# Patient Record
Sex: Female | Born: 1950 | Race: Black or African American | Hispanic: No | Marital: Single | Smoking: Current every day smoker
Health system: Southern US, Community
[De-identification: ages and names within clinical notes are randomized; demographics above are authoritative.]

## PROBLEM LIST (undated history)

## (undated) DIAGNOSIS — E119 Type 2 diabetes mellitus without complications: Secondary | ICD-10-CM

## (undated) DIAGNOSIS — I10 Essential (primary) hypertension: Secondary | ICD-10-CM

## (undated) DIAGNOSIS — I509 Heart failure, unspecified: Secondary | ICD-10-CM

## (undated) HISTORY — PX: CAROTID ENDARTERECTOMY: SUR193

---

## 2011-05-07 DIAGNOSIS — E119 Type 2 diabetes mellitus without complications: Secondary | ICD-10-CM

## 2011-05-07 DIAGNOSIS — I10 Essential (primary) hypertension: Secondary | ICD-10-CM | POA: Insufficient documentation

## 2016-07-11 DIAGNOSIS — I639 Cerebral infarction, unspecified: Secondary | ICD-10-CM

## 2016-07-11 HISTORY — DX: Cerebral infarction, unspecified: I63.9

## 2017-06-09 DIAGNOSIS — I10 Essential (primary) hypertension: Secondary | ICD-10-CM | POA: Diagnosis present

## 2017-06-09 DIAGNOSIS — E785 Hyperlipidemia, unspecified: Secondary | ICD-10-CM | POA: Diagnosis present

## 2018-01-16 DIAGNOSIS — E113519 Type 2 diabetes mellitus with proliferative diabetic retinopathy with macular edema, unspecified eye: Secondary | ICD-10-CM | POA: Insufficient documentation

## 2018-01-25 DIAGNOSIS — R011 Cardiac murmur, unspecified: Secondary | ICD-10-CM | POA: Insufficient documentation

## 2018-05-24 DIAGNOSIS — I779 Disorder of arteries and arterioles, unspecified: Secondary | ICD-10-CM | POA: Insufficient documentation

## 2019-02-24 DIAGNOSIS — E1121 Type 2 diabetes mellitus with diabetic nephropathy: Secondary | ICD-10-CM | POA: Diagnosis present

## 2020-01-29 DIAGNOSIS — D509 Iron deficiency anemia, unspecified: Secondary | ICD-10-CM | POA: Insufficient documentation

## 2020-03-02 DIAGNOSIS — D472 Monoclonal gammopathy: Secondary | ICD-10-CM | POA: Insufficient documentation

## 2020-09-20 ENCOUNTER — Encounter (HOSPITAL_COMMUNITY): Payer: Self-pay

## 2020-09-20 ENCOUNTER — Other Ambulatory Visit: Payer: Self-pay

## 2020-09-20 ENCOUNTER — Other Ambulatory Visit (HOSPITAL_COMMUNITY): Payer: Self-pay

## 2020-09-20 ENCOUNTER — Emergency Department (HOSPITAL_COMMUNITY): Payer: Medicare Other

## 2020-09-20 DIAGNOSIS — R0902 Hypoxemia: Secondary | ICD-10-CM

## 2020-09-20 DIAGNOSIS — K0530 Chronic periodontitis, unspecified: Secondary | ICD-10-CM | POA: Diagnosis present

## 2020-09-20 DIAGNOSIS — E119 Type 2 diabetes mellitus without complications: Secondary | ICD-10-CM | POA: Diagnosis not present

## 2020-09-20 DIAGNOSIS — K0889 Other specified disorders of teeth and supporting structures: Secondary | ICD-10-CM

## 2020-09-20 DIAGNOSIS — K036 Deposits [accretions] on teeth: Secondary | ICD-10-CM

## 2020-09-20 DIAGNOSIS — J9621 Acute and chronic respiratory failure with hypoxia: Secondary | ICD-10-CM | POA: Diagnosis present

## 2020-09-20 DIAGNOSIS — Z803 Family history of malignant neoplasm of breast: Secondary | ICD-10-CM

## 2020-09-20 DIAGNOSIS — I21A1 Myocardial infarction type 2: Secondary | ICD-10-CM | POA: Diagnosis present

## 2020-09-20 DIAGNOSIS — Z20822 Contact with and (suspected) exposure to covid-19: Secondary | ICD-10-CM | POA: Diagnosis present

## 2020-09-20 DIAGNOSIS — I352 Nonrheumatic aortic (valve) stenosis with insufficiency: Secondary | ICD-10-CM | POA: Diagnosis present

## 2020-09-20 DIAGNOSIS — Z801 Family history of malignant neoplasm of trachea, bronchus and lung: Secondary | ICD-10-CM

## 2020-09-20 DIAGNOSIS — I5043 Acute on chronic combined systolic (congestive) and diastolic (congestive) heart failure: Secondary | ICD-10-CM | POA: Diagnosis present

## 2020-09-20 DIAGNOSIS — K5900 Constipation, unspecified: Secondary | ICD-10-CM | POA: Diagnosis not present

## 2020-09-20 DIAGNOSIS — I214 Non-ST elevation (NSTEMI) myocardial infarction: Secondary | ICD-10-CM | POA: Diagnosis present

## 2020-09-20 DIAGNOSIS — E1165 Type 2 diabetes mellitus with hyperglycemia: Secondary | ICD-10-CM | POA: Diagnosis present

## 2020-09-20 DIAGNOSIS — Z91041 Radiographic dye allergy status: Secondary | ICD-10-CM

## 2020-09-20 DIAGNOSIS — R0603 Acute respiratory distress: Secondary | ICD-10-CM | POA: Diagnosis present

## 2020-09-20 DIAGNOSIS — R9431 Abnormal electrocardiogram [ECG] [EKG]: Secondary | ICD-10-CM | POA: Diagnosis not present

## 2020-09-20 DIAGNOSIS — Z7984 Long term (current) use of oral hypoglycemic drugs: Secondary | ICD-10-CM | POA: Diagnosis not present

## 2020-09-20 DIAGNOSIS — I2510 Atherosclerotic heart disease of native coronary artery without angina pectoris: Secondary | ICD-10-CM | POA: Diagnosis present

## 2020-09-20 DIAGNOSIS — N1832 Chronic kidney disease, stage 3b: Secondary | ICD-10-CM | POA: Diagnosis present

## 2020-09-20 DIAGNOSIS — E43 Unspecified severe protein-calorie malnutrition: Secondary | ICD-10-CM | POA: Diagnosis present

## 2020-09-20 DIAGNOSIS — N179 Acute kidney failure, unspecified: Secondary | ICD-10-CM | POA: Diagnosis present

## 2020-09-20 DIAGNOSIS — D631 Anemia in chronic kidney disease: Secondary | ICD-10-CM | POA: Diagnosis present

## 2020-09-20 DIAGNOSIS — F17210 Nicotine dependence, cigarettes, uncomplicated: Secondary | ICD-10-CM | POA: Diagnosis present

## 2020-09-20 DIAGNOSIS — K029 Dental caries, unspecified: Secondary | ICD-10-CM | POA: Diagnosis present

## 2020-09-20 DIAGNOSIS — G9341 Metabolic encephalopathy: Secondary | ICD-10-CM | POA: Diagnosis not present

## 2020-09-20 DIAGNOSIS — R0602 Shortness of breath: Secondary | ICD-10-CM

## 2020-09-20 DIAGNOSIS — Z66 Do not resuscitate: Secondary | ICD-10-CM | POA: Diagnosis not present

## 2020-09-20 DIAGNOSIS — N183 Chronic kidney disease, stage 3 unspecified: Secondary | ICD-10-CM | POA: Diagnosis present

## 2020-09-20 DIAGNOSIS — I350 Nonrheumatic aortic (valve) stenosis: Secondary | ICD-10-CM | POA: Diagnosis not present

## 2020-09-20 DIAGNOSIS — R570 Cardiogenic shock: Secondary | ICD-10-CM | POA: Diagnosis not present

## 2020-09-20 DIAGNOSIS — I70221 Atherosclerosis of native arteries of extremities with rest pain, right leg: Secondary | ICD-10-CM | POA: Diagnosis not present

## 2020-09-20 DIAGNOSIS — M79606 Pain in leg, unspecified: Secondary | ICD-10-CM | POA: Diagnosis not present

## 2020-09-20 DIAGNOSIS — E872 Acidosis, unspecified: Secondary | ICD-10-CM

## 2020-09-20 DIAGNOSIS — E1122 Type 2 diabetes mellitus with diabetic chronic kidney disease: Secondary | ICD-10-CM | POA: Diagnosis present

## 2020-09-20 DIAGNOSIS — Z972 Presence of dental prosthetic device (complete) (partial): Secondary | ICD-10-CM | POA: Diagnosis not present

## 2020-09-20 DIAGNOSIS — I25118 Atherosclerotic heart disease of native coronary artery with other forms of angina pectoris: Secondary | ICD-10-CM | POA: Diagnosis not present

## 2020-09-20 DIAGNOSIS — E1151 Type 2 diabetes mellitus with diabetic peripheral angiopathy without gangrene: Secondary | ICD-10-CM | POA: Diagnosis present

## 2020-09-20 DIAGNOSIS — Z79899 Other long term (current) drug therapy: Secondary | ICD-10-CM

## 2020-09-20 DIAGNOSIS — R34 Anuria and oliguria: Secondary | ICD-10-CM | POA: Diagnosis not present

## 2020-09-20 DIAGNOSIS — K056 Periodontal disease, unspecified: Secondary | ICD-10-CM

## 2020-09-20 DIAGNOSIS — Z8249 Family history of ischemic heart disease and other diseases of the circulatory system: Secondary | ICD-10-CM | POA: Diagnosis not present

## 2020-09-20 DIAGNOSIS — E785 Hyperlipidemia, unspecified: Secondary | ICD-10-CM | POA: Diagnosis present

## 2020-09-20 DIAGNOSIS — E1159 Type 2 diabetes mellitus with other circulatory complications: Secondary | ICD-10-CM | POA: Diagnosis not present

## 2020-09-20 DIAGNOSIS — I471 Supraventricular tachycardia: Secondary | ICD-10-CM | POA: Diagnosis not present

## 2020-09-20 DIAGNOSIS — I69328 Other speech and language deficits following cerebral infarction: Secondary | ICD-10-CM

## 2020-09-20 DIAGNOSIS — N184 Chronic kidney disease, stage 4 (severe): Secondary | ICD-10-CM | POA: Diagnosis not present

## 2020-09-20 DIAGNOSIS — Z7189 Other specified counseling: Secondary | ICD-10-CM | POA: Diagnosis not present

## 2020-09-20 DIAGNOSIS — Z6824 Body mass index (BMI) 24.0-24.9, adult: Secondary | ICD-10-CM

## 2020-09-20 DIAGNOSIS — I5023 Acute on chronic systolic (congestive) heart failure: Secondary | ICD-10-CM | POA: Diagnosis present

## 2020-09-20 DIAGNOSIS — Z7982 Long term (current) use of aspirin: Secondary | ICD-10-CM

## 2020-09-20 DIAGNOSIS — Z886 Allergy status to analgesic agent status: Secondary | ICD-10-CM

## 2020-09-20 DIAGNOSIS — I739 Peripheral vascular disease, unspecified: Secondary | ICD-10-CM | POA: Diagnosis not present

## 2020-09-20 DIAGNOSIS — K011 Impacted teeth: Secondary | ICD-10-CM

## 2020-09-20 DIAGNOSIS — I130 Hypertensive heart and chronic kidney disease with heart failure and stage 1 through stage 4 chronic kidney disease, or unspecified chronic kidney disease: Principal | ICD-10-CM | POA: Diagnosis present

## 2020-09-20 DIAGNOSIS — I25110 Atherosclerotic heart disease of native coronary artery with unstable angina pectoris: Secondary | ICD-10-CM | POA: Diagnosis not present

## 2020-09-20 DIAGNOSIS — N141 Nephropathy induced by other drugs, medicaments and biological substances: Secondary | ICD-10-CM | POA: Diagnosis not present

## 2020-09-20 DIAGNOSIS — I10 Essential (primary) hypertension: Secondary | ICD-10-CM | POA: Diagnosis present

## 2020-09-20 DIAGNOSIS — I493 Ventricular premature depolarization: Secondary | ICD-10-CM | POA: Diagnosis not present

## 2020-09-20 DIAGNOSIS — E782 Mixed hyperlipidemia: Secondary | ICD-10-CM | POA: Diagnosis not present

## 2020-09-20 DIAGNOSIS — I255 Ischemic cardiomyopathy: Secondary | ICD-10-CM | POA: Diagnosis present

## 2020-09-20 DIAGNOSIS — N171 Acute kidney failure with acute cortical necrosis: Secondary | ICD-10-CM | POA: Diagnosis not present

## 2020-09-20 DIAGNOSIS — E11649 Type 2 diabetes mellitus with hypoglycemia without coma: Secondary | ICD-10-CM | POA: Diagnosis not present

## 2020-09-20 DIAGNOSIS — I998 Other disorder of circulatory system: Secondary | ICD-10-CM | POA: Diagnosis not present

## 2020-09-20 DIAGNOSIS — R739 Hyperglycemia, unspecified: Secondary | ICD-10-CM

## 2020-09-20 DIAGNOSIS — M25512 Pain in left shoulder: Secondary | ICD-10-CM | POA: Diagnosis not present

## 2020-09-20 DIAGNOSIS — Z515 Encounter for palliative care: Secondary | ICD-10-CM | POA: Diagnosis not present

## 2020-09-20 DIAGNOSIS — M79604 Pain in right leg: Secondary | ICD-10-CM | POA: Diagnosis not present

## 2020-09-20 DIAGNOSIS — Z01818 Encounter for other preprocedural examination: Secondary | ICD-10-CM

## 2020-09-20 DIAGNOSIS — Z7902 Long term (current) use of antithrombotics/antiplatelets: Secondary | ICD-10-CM

## 2020-09-20 DIAGNOSIS — K08109 Complete loss of teeth, unspecified cause, unspecified class: Secondary | ICD-10-CM

## 2020-09-20 DIAGNOSIS — J449 Chronic obstructive pulmonary disease, unspecified: Secondary | ICD-10-CM | POA: Diagnosis present

## 2020-09-20 DIAGNOSIS — J9601 Acute respiratory failure with hypoxia: Secondary | ICD-10-CM | POA: Diagnosis not present

## 2020-09-20 DIAGNOSIS — I6523 Occlusion and stenosis of bilateral carotid arteries: Secondary | ICD-10-CM | POA: Diagnosis present

## 2020-09-20 DIAGNOSIS — E875 Hyperkalemia: Secondary | ICD-10-CM | POA: Diagnosis not present

## 2020-09-20 DIAGNOSIS — I509 Heart failure, unspecified: Secondary | ICD-10-CM

## 2020-09-20 DIAGNOSIS — E1121 Type 2 diabetes mellitus with diabetic nephropathy: Secondary | ICD-10-CM | POA: Diagnosis present

## 2020-09-20 DIAGNOSIS — T508X5A Adverse effect of diagnostic agents, initial encounter: Secondary | ICD-10-CM | POA: Diagnosis not present

## 2020-09-20 DIAGNOSIS — I252 Old myocardial infarction: Secondary | ICD-10-CM

## 2020-09-20 HISTORY — DX: Heart failure, unspecified: I50.9

## 2020-09-20 HISTORY — DX: Essential (primary) hypertension: I10

## 2020-09-20 HISTORY — DX: Type 2 diabetes mellitus without complications: E11.9

## 2020-09-20 LAB — TROPONIN I (HIGH SENSITIVITY)
Troponin I (High Sensitivity): 68 ng/L — ABNORMAL HIGH (ref <18)
Troponin I (High Sensitivity): 318 ng/L (ref <18)
Troponin I (High Sensitivity): 1109 ng/L (ref <18)
Troponin I (High Sensitivity): 1413 ng/L (ref <18)
Troponin I (High Sensitivity): 2156 ng/L (ref <18)

## 2020-09-20 LAB — HEMOGLOBIN A1C
Hgb A1c MFr Bld: 9.3 % — ABNORMAL HIGH (ref 4.8–5.6)
Mean Plasma Glucose: 220.21 mg/dL

## 2020-09-20 LAB — BASIC METABOLIC PANEL
Anion gap: 11 (ref 5–15)
BUN: 25 mg/dL — ABNORMAL HIGH (ref 8–23)
CO2: 16 mmol/L — ABNORMAL LOW (ref 22–32)
Calcium: 8.1 mg/dL — ABNORMAL LOW (ref 8.9–10.3)
Chloride: 108 mmol/L (ref 98–111)
Creatinine, Ser: 1.9 mg/dL — ABNORMAL HIGH (ref 0.44–1.00)
GFR, Estimated: 28 mL/min — ABNORMAL LOW (ref >60)
Glucose, Bld: 537 mg/dL (ref 70–99)
Potassium: 3.7 mmol/L (ref 3.5–5.1)
Sodium: 135 mmol/L (ref 135–145)

## 2020-09-20 LAB — CBC
HCT: 31.5 % — ABNORMAL LOW (ref 36.0–46.0)
HCT: 30 % — ABNORMAL LOW (ref 36.0–46.0)
Hemoglobin: 9.6 g/dL — ABNORMAL LOW (ref 12.0–15.0)
Hemoglobin: 9.8 g/dL — ABNORMAL LOW (ref 12.0–15.0)
MCH: 30.5 pg (ref 26.0–34.0)
MCH: 30.5 pg (ref 26.0–34.0)
MCHC: 30.5 g/dL (ref 30.0–36.0)
MCHC: 32.7 g/dL (ref 30.0–36.0)
MCV: 100 fL (ref 80.0–100.0)
MCV: 93.5 fL (ref 80.0–100.0)
Platelets: 463 10*3/uL — ABNORMAL HIGH (ref 150–400)
Platelets: 409 10*3/uL — ABNORMAL HIGH (ref 150–400)
RBC: 3.15 MIL/uL — ABNORMAL LOW (ref 3.87–5.11)
RBC: 3.21 MIL/uL — ABNORMAL LOW (ref 3.87–5.11)
RDW: 13 % (ref 11.5–15.5)
RDW: 12.8 % (ref 11.5–15.5)
WBC: 8.7 10*3/uL (ref 4.0–10.5)
WBC: 8 10*3/uL (ref 4.0–10.5)
nRBC: 0 % (ref 0.0–0.2)
nRBC: 0 % (ref 0.0–0.2)

## 2020-09-20 LAB — I-STAT VENOUS BLOOD GAS, ED
Acid-base deficit: 9 mmol/L — ABNORMAL HIGH (ref 0.0–2.0)
Bicarbonate: 18.3 mmol/L — ABNORMAL LOW (ref 20.0–28.0)
Calcium, Ion: 1.08 mmol/L — ABNORMAL LOW (ref 1.15–1.40)
HCT: 30 % — ABNORMAL LOW (ref 36.0–46.0)
Hemoglobin: 10.2 g/dL — ABNORMAL LOW (ref 12.0–15.0)
O2 Saturation: 98 %
Potassium: 3.6 mmol/L (ref 3.5–5.1)
Sodium: 137 mmol/L (ref 135–145)
TCO2: 20 mmol/L — ABNORMAL LOW (ref 22–32)
pCO2, Ven: 44.5 mmHg (ref 44.0–60.0)
pH, Ven: 7.222 — ABNORMAL LOW (ref 7.250–7.430)
pO2, Ven: 132 mmHg — ABNORMAL HIGH (ref 32.0–45.0)

## 2020-09-20 LAB — HIV ANTIBODY (ROUTINE TESTING W REFLEX): HIV Screen 4th Generation wRfx: NONREACTIVE

## 2020-09-20 LAB — GLUCOSE, CAPILLARY
Glucose-Capillary: 267 mg/dL — ABNORMAL HIGH (ref 70–99)
Glucose-Capillary: 73 mg/dL (ref 70–99)

## 2020-09-20 LAB — RESP PANEL BY RT-PCR (FLU A&B, COVID) ARPGX2
Influenza A by PCR: NEGATIVE
Influenza B by PCR: NEGATIVE
SARS Coronavirus 2 by RT PCR: NEGATIVE

## 2020-09-20 LAB — LACTIC ACID, PLASMA
Lactic Acid, Venous: 2.4 mmol/L (ref 0.5–1.9)
Lactic Acid, Venous: 1.2 mmol/L (ref 0.5–1.9)

## 2020-09-20 LAB — BRAIN NATRIURETIC PEPTIDE: B Natriuretic Peptide: 1649.4 pg/mL — ABNORMAL HIGH (ref 0.0–100.0)

## 2020-09-20 LAB — HEPARIN LEVEL (UNFRACTIONATED): Heparin Unfractionated: 0.4 IU/mL (ref 0.30–0.70)

## 2020-09-20 MED ORDER — INSULIN ASPART 100 UNIT/ML ~~LOC~~ SOLN
8.0000 [IU] | Freq: Once | SUBCUTANEOUS | Status: AC
  Administered 2020-09-20: 8 [IU] via SUBCUTANEOUS

## 2020-09-20 MED ORDER — ENOXAPARIN SODIUM 30 MG/0.3ML ~~LOC~~ SOLN
30.0000 mg | SUBCUTANEOUS | Status: DC
  Administered 2020-09-20: 30 mg via SUBCUTANEOUS
  Filled 2020-09-20: qty 0.3

## 2020-09-20 MED ORDER — ASPIRIN EC 81 MG PO TBEC
81.0000 mg | DELAYED_RELEASE_TABLET | Freq: Every day | ORAL | Status: DC
  Administered 2020-09-21 – 2020-09-30 (×9): 81 mg via ORAL
  Filled 2020-09-20 (×11): qty 1

## 2020-09-20 MED ORDER — ISOSORBIDE MONONITRATE ER 30 MG PO TB24
30.0000 mg | ORAL_TABLET | Freq: Every day | ORAL | Status: DC
  Administered 2020-09-20 – 2020-09-30 (×11): 30 mg via ORAL
  Filled 2020-09-20 (×11): qty 1

## 2020-09-20 MED ORDER — FUROSEMIDE 10 MG/ML IJ SOLN
40.0000 mg | Freq: Once | INTRAMUSCULAR | Status: AC
  Administered 2020-09-20: 40 mg via INTRAVENOUS
  Filled 2020-09-20: qty 4

## 2020-09-20 MED ORDER — ASPIRIN 81 MG PO CHEW
324.0000 mg | CHEWABLE_TABLET | Freq: Once | ORAL | Status: AC
  Administered 2020-09-20: 324 mg via ORAL
  Filled 2020-09-20: qty 4

## 2020-09-20 MED ORDER — SODIUM CHLORIDE 0.9% FLUSH
3.0000 mL | INTRAVENOUS | Status: DC | PRN
Start: 2020-09-20 — End: 2020-09-29

## 2020-09-20 MED ORDER — AMLODIPINE BESYLATE 10 MG PO TABS
10.0000 mg | ORAL_TABLET | Freq: Every day | ORAL | Status: DC
  Administered 2020-09-20 – 2020-09-22 (×3): 10 mg via ORAL
  Filled 2020-09-20 (×3): qty 1

## 2020-09-20 MED ORDER — HEPARIN BOLUS VIA INFUSION
2000.0000 [IU] | Freq: Once | INTRAVENOUS | Status: AC
  Administered 2020-09-20: 2000 [IU] via INTRAVENOUS
  Filled 2020-09-20: qty 2000

## 2020-09-20 MED ORDER — INSULIN ASPART 100 UNIT/ML ~~LOC~~ SOLN
3.0000 [IU] | Freq: Three times a day (TID) | SUBCUTANEOUS | Status: DC
  Administered 2020-09-20 – 2020-09-30 (×25): 3 [IU] via SUBCUTANEOUS

## 2020-09-20 MED ORDER — SODIUM CHLORIDE 0.9% FLUSH
3.0000 mL | Freq: Two times a day (BID) | INTRAVENOUS | Status: DC
  Administered 2020-09-20 – 2020-09-28 (×9): 3 mL via INTRAVENOUS

## 2020-09-20 MED ORDER — NITROGLYCERIN 2 % TD OINT: 1.0000 [in_us] | TOPICAL_OINTMENT | Freq: Four times a day (QID) | TRANSDERMAL | Status: DC

## 2020-09-20 MED ORDER — SODIUM CHLORIDE 0.9 % IV SOLN
250.0000 mL | INTRAVENOUS | Status: DC | PRN
  Administered 2020-09-24 – 2020-09-26 (×2): 250 mL via INTRAVENOUS

## 2020-09-20 MED ORDER — EZETIMIBE 10 MG PO TABS
10.0000 mg | ORAL_TABLET | Freq: Every day | ORAL | Status: DC
Start: 2020-09-20 — End: 2020-10-01
  Administered 2020-09-20 – 2020-09-30 (×11): 10 mg via ORAL
  Filled 2020-09-20 (×12): qty 1

## 2020-09-20 MED ORDER — ONDANSETRON HCL 4 MG/2ML IJ SOLN
4.0000 mg | Freq: Four times a day (QID) | INTRAMUSCULAR | Status: DC | PRN
Start: 2020-09-20 — End: 2020-09-24

## 2020-09-20 MED ORDER — ACETAMINOPHEN 325 MG PO TABS
650.0000 mg | ORAL_TABLET | ORAL | Status: DC | PRN
  Administered 2020-09-25 – 2020-10-01 (×11): 650 mg via ORAL
  Filled 2020-09-20 (×11): qty 2

## 2020-09-20 MED ORDER — PRAVASTATIN SODIUM 40 MG PO TABS
40.0000 mg | ORAL_TABLET | Freq: Every day | ORAL | Status: DC
  Administered 2020-09-20 – 2020-09-21 (×2): 40 mg via ORAL
  Filled 2020-09-20 (×2): qty 1

## 2020-09-20 MED ORDER — FUROSEMIDE 10 MG/ML IJ SOLN
80.0000 mg | Freq: Two times a day (BID) | INTRAMUSCULAR | Status: DC
  Administered 2020-09-20 – 2020-09-23 (×6): 80 mg via INTRAVENOUS
  Filled 2020-09-20 (×6): qty 8

## 2020-09-20 MED ORDER — HEPARIN (PORCINE) 25000 UT/250ML-% IV SOLN
850.0000 [IU]/h | INTRAVENOUS | Status: AC
  Administered 2020-09-20 – 2020-09-21 (×2): 750 [IU]/h via INTRAVENOUS
  Filled 2020-09-20 (×3): qty 250

## 2020-09-20 MED ORDER — INSULIN ASPART 100 UNIT/ML ~~LOC~~ SOLN
0.0000 [IU] | Freq: Three times a day (TID) | SUBCUTANEOUS | Status: DC
  Administered 2020-09-20: 8 [IU] via SUBCUTANEOUS
  Administered 2020-09-21: 2 [IU] via SUBCUTANEOUS
  Administered 2020-09-21: 8 [IU] via SUBCUTANEOUS
  Administered 2020-09-21: 3 [IU] via SUBCUTANEOUS
  Administered 2020-09-22: 8 [IU] via SUBCUTANEOUS
  Administered 2020-09-22: 2 [IU] via SUBCUTANEOUS
  Administered 2020-09-22: 5 [IU] via SUBCUTANEOUS
  Administered 2020-09-23: 3 [IU] via SUBCUTANEOUS
  Administered 2020-09-23: 5 [IU] via SUBCUTANEOUS
  Administered 2020-09-23: 3 [IU] via SUBCUTANEOUS
  Administered 2020-09-24: 5 [IU] via SUBCUTANEOUS
  Administered 2020-09-24: 15 [IU] via SUBCUTANEOUS
  Administered 2020-09-24: 8 [IU] via SUBCUTANEOUS
  Administered 2020-09-25: 11 [IU] via SUBCUTANEOUS
  Administered 2020-09-25: 5 [IU] via SUBCUTANEOUS
  Administered 2020-09-25: 15 [IU] via SUBCUTANEOUS
  Administered 2020-09-26: 5 [IU] via SUBCUTANEOUS
  Administered 2020-09-26 (×2): 8 [IU] via SUBCUTANEOUS
  Administered 2020-09-27: 3 [IU] via SUBCUTANEOUS
  Administered 2020-09-27: 8 [IU] via SUBCUTANEOUS
  Administered 2020-09-27: 5 [IU] via SUBCUTANEOUS
  Administered 2020-09-28: 8 [IU] via SUBCUTANEOUS
  Administered 2020-09-28: 5 [IU] via SUBCUTANEOUS
  Administered 2020-09-29: 3 [IU] via SUBCUTANEOUS
  Administered 2020-09-29: 11 [IU] via SUBCUTANEOUS
  Administered 2020-09-30: 8 [IU] via SUBCUTANEOUS
  Administered 2020-09-30: 3 [IU] via SUBCUTANEOUS
  Administered 2020-09-30: 5 [IU] via SUBCUTANEOUS
  Administered 2020-10-01: 8 [IU] via SUBCUTANEOUS

## 2020-09-20 MED ORDER — CLOPIDOGREL BISULFATE 75 MG PO TABS
75.0000 mg | ORAL_TABLET | Freq: Every day | ORAL | Status: DC
  Administered 2020-09-20 – 2020-09-25 (×6): 75 mg via ORAL
  Filled 2020-09-20 (×6): qty 1

## 2020-09-20 MED ORDER — HYDRALAZINE HCL 25 MG PO TABS
25.0000 mg | ORAL_TABLET | Freq: Three times a day (TID) | ORAL | Status: DC
  Administered 2020-09-20 – 2020-09-30 (×27): 25 mg via ORAL
  Filled 2020-09-20 (×30): qty 1

## 2020-09-20 MED ORDER — INSULIN ASPART 100 UNIT/ML ~~LOC~~ SOLN
0.0000 [IU] | Freq: Every day | SUBCUTANEOUS | Status: DC
  Administered 2020-09-22: 2 [IU] via SUBCUTANEOUS
  Administered 2020-09-25: 3 [IU] via SUBCUTANEOUS
  Administered 2020-09-26: 4 [IU] via SUBCUTANEOUS
  Administered 2020-09-28 – 2020-09-29 (×2): 2 [IU] via SUBCUTANEOUS

## 2020-09-20 NOTE — Consult Note (Signed)
Cardiology Consultation:  Patient ID: Sara [name] MRN: [id number]; DOB: [birth year]  Admit date: 10/07/2020 Date of Consult: 10/02/2020  Primary Care Provider: Joyice Faster, MD Primary Cardiologist: No primary care provider on file.  Primary Electrophysiologist:  None  Patient Profile:  Sara [name] is a 70 y.o. female with a hx of systolic heart failure (EF 40-45% 1 year ago), diabetes, hypertension, stroke who is being seen today for the evaluation of congestive heart failure/elevated troponin at the request of Lalla Brothers, MD.  History of Present Illness:  Sara [name] presents with 1 week of progressively worsening shortness of breath.  She reports swelling in her legs as well.  She reports any heavy activity can get a profound out of breath.  She reports has had no chest pain.  No syncope.  She reports no palpitations.  On arrival to the ER she was mildly hypertensive.  Labs notable for BNP of 1649.  Initial troponin 68 and 318 on repeat.  She was also found to have severely elevated glucose value up to 537.  She is acidotic with a bicarbonate of 16.  Anion gap 11.  Also of note she has a serum creatinine of 1.90 with a GFR of 28.  Chest x-ray shows pulmonary edema with vascular congestion. She reports no chest pain.  She apparently was admitted in [place] for congestive heart failure in July 2021.  As part of that work-up she was found to have severe three-vessel CAD and ischemic cardiomyopathy with an EF of 40-45%.  She underwent cardiac MRI which showed she has viable myocardium.  She apparently was supposed to followed up with a cardiac surgeon for bypass surgery evaluation but never did due to the coronavirus pandemic.  She appears to present again with a similar picture.  She is in decompensated systolic heart failure with sinus tachycardia and frequent PVCs.  I fear her heart failure has worsened.  It appears her troponin elevation is minimal and mild compared to the level  of CAD she does have.  Problem List 1. Ischemic cardiomyopathy, 40-45% -LHC 02/04/2020: 70% mid LAD, dLCX 70%, 80% mRCA, 50% rPDA -viable per CMR 02/06/2020 2. Severe 3-vessel CAD  3. Carotid artery disease s/p L CEA  Heart Pathway Score:       Past Medical History: Past Medical History:  Diagnosis Date  . CHF (congestive heart failure) ([place])   . Diabetes mellitus without complication ([place])   . Hypertension   . Stroke [facility]) 2018   Reports right sided stroke, continues to have some slurred speech, but no motor defecients     Past Surgical History: Past Surgical History:  Procedure Laterality Date  . CAROTID ENDARTERECTOMY       Home Medications:  Prior to Admission medications   Medication Sig Start Date End Date Taking? Authorizing Provider  amLODipine (NORVASC) 10 MG tablet Take 10 mg by mouth daily.   Yes [provider]  aspirin EC 81 MG tablet Take 81 mg by mouth daily. Swallow whole.   Yes [provider]  cholecalciferol (VITAMIN D3) 25 MCG (1000 UNIT) tablet Take 1,000 Units by mouth daily. 3 times weekly.  Sunday,Tuesda & Friday   Yes [provider]  clopidogrel (PLAVIX) 75 MG tablet Take 75 mg by mouth daily.   Yes [provider]  ezetimibe (ZETIA) 10 MG tablet Take 10 mg by mouth daily.   Yes [provider]  glipiZIDE (GLUCOTROL XL) 10 MG 24 hr tablet Take 10 mg by mouth 2 (  two) times daily.   Yes [provider]  Multiple Vitamins-Minerals (MULTIVITAMIN WITH MINERALS) tablet Take 1 tablet by mouth daily.   Yes [provider]  pravastatin (PRAVACHOL) 40 MG tablet Take 40 mg by mouth daily.   Yes [provider]  SitaGLIPtin-MetFORMIN HCl (JANUMET XR) [phone] MG TB24 Take 0.5 tablets by mouth daily.   Yes [provider]    Inpatient Medications: Scheduled Meds: . amLODipine  10 mg Oral Daily  . [START ON 09/21/2020] aspirin EC  81 mg Oral Daily  . clopidogrel  75 mg Oral Daily   . enoxaparin (LOVENOX) injection  30 mg Subcutaneous Q24H  . ezetimibe  10 mg Oral Daily  . furosemide  80 mg Intravenous BID  . insulin aspart  0-15 Units Subcutaneous TID WC  . insulin aspart  0-5 Units Subcutaneous QHS  . insulin aspart  3 Units Subcutaneous TID WC  . pravastatin  40 mg Oral Daily  . sodium chloride flush  3 mL Intravenous Q12H   Continuous Infusions: . sodium chloride     PRN Meds: sodium chloride, acetaminophen, ondansetron (ZOFRAN) IV, sodium chloride flush  Allergies:    Allergies  Allergen Reactions  . Naproxen Nausea And Vomiting    Social History:   Social History   Socioeconomic History  . Marital status: Single    Spouse name: Not on file  . Number of children: Not on file  . Years of education: Not on file  . Highest education level: Not on file  Occupational History  . Not on file  Tobacco Use  . Smoking status: Not on file  . Smokeless tobacco: Not on file  Substance and Sexual Activity  . Alcohol use: Not on file  . Drug use: Not on file  . Sexual activity: Not on file  Other Topics Concern  . Not on file  Social History Narrative  . Not on file   Social Determinants of Health   Financial Resource Strain: Not on file  Food Insecurity: Not on file  Transportation Needs: Not on file  Physical Activity: Not on file  Stress: Not on file  Social Connections: Not on file  Intimate Partner Violence: Not on file     Family History:   History reviewed. No pertinent family history.   ROS:  All other ROS reviewed and negative. Pertinent positives noted in the HPI.     Physical Exam/Data:   Vitals:   09/09/2020 1015 09/30/2020 1030 09/13/2020 1045 10/02/2020 1115  BP: 140/69 (!) 161/94 (!) 148/66 (!) 146/84  Pulse: 95 (!) 102 (!) 101 (!) 102  Resp: (!) 23 (!) 21 17 (!) 21  SpO2: 96% 94% 96% 99%   No intake or output data in the 24 hours ending 09/13/2020 1252 No flowsheet data found.  There is no height or weight on file to calculate  BMI.  General: Ill-appearing Head: Atraumatic, normal size  Eyes: PEERLA, EOMI  Neck: Supple, JVD 15 to 17 cm of water Endocrine: No thryomegaly Cardiac: Normal S1, S2; tachycardia noted, 3/6 systolic ejection murmur, late peaking Lungs: Crackles at the lung bases Abd: Soft, nontender, no hepatomegaly  Ext: Trace edema Musculoskeletal: No deformities, BUE and BLE strength normal and equal Skin: Warm and dry, no rashes   Neuro: Alert and oriented to person, place, time, and situation, CNII-XII grossly intact, no focal deficits  Psych: Normal mood and affect   EKG:  The EKG was personally reviewed and demonstrates: Sinus tachycardia, LVH by voltage,  PVCs noted Telemetry:  Telemetry was personally reviewed and demonstrates: Sinus tachycardia in the low 100s, PVCs noted  Relevant CV Studies: Echo [facility] 01/26/2020 Mild LV dysfunction. EF is in the 40-45% range. The pattern is  worrisome for CAD.  2. Mild calcific aortic stenosis. Gradients are 20/11 mmHg.  3. Mild LVH  Cardiac MRI 02/06/2020 -Quantitative LVEF 51%. LV cavity is mildly enlarged (LVEDVI 94 ml/m2). LV systolic function is mildly  decreased globally.   -Quantitative RVEF 67%. RV cavity size is normal. RV systolic function is normal.   -LGE: 1. Small subendocardial infarction of the basal anterior/mid anterolateral wall (LAD or ramus  distribution)  2. Small subendocardial infarction of the mid inferoseptum (RCA distribution)   Dysfunctional myocardium in all 3 coronary artery distributions arepredominantly viable   -There is mild to moderate aortic valve stenosis.   LEFT VENTRICLE: Quantitative LVEF 51%. LV cavity is mildly enlarged (LVEDVI 94 ml/m2). LV systolic function is mildly  decreased globally. There is no LV mass/thrombus.   VIABILITY: LV scar size is 4%. 1. Small subendocardial infarction of the basal anterior/mid anterolateral wall (LAD or  ramus distribution)  2. Small subendocardial infarction of  the mid inferoseptum (RCA distribution)   RIGHT VENTRICLE: Quantitative RVEF 67%. RV cavity size is normal. RV systolic function is normal.   LEFT ATRIUM: LA is moderately enlarged.   RIGHT ATRIUM: RA is mildly enlarged.   PLEURAL EFFUSION: There is a small left pleural effusion.   AORTIC VALVE: Aortic valve is trileaflet. Aortic valve is moderately thickened. Planimetered aortic valve area 1.0 cm\S\2.  Peak aortic valve velocity 240 cm/sec. Peak aortic valve gradient 23 mmHg. There is a mild to moderate  aortic valve stenosis.   MITRAL VALVE: Mitral valve leaflets are normal. There is mild mitral regurgitation.   TRICUSPID VALVE: Tricuspid valve leaflets are normal. There is trivial tricuspid regurgitation.   AORTIC ROOT: The aortic root is normal.    LHC 02/04/2020: Mid LAD 70% stenosis, distal LCx 70% stenosis, mid RCA 80% stenosis, proximal RPDA 50% stenosis, left main unremarkable. Ischemic cardiomyopathy    Laboratory Data: High Sensitivity Troponin:   Recent Labs  Lab 09/24/2020 0704 09/29/2020 0926  TROPONINIHS 68* 318*     Cardiac EnzymesNo results for input(s): TROPONINI in the last 168 hours. No results for input(s): TROPIPOC in the last 168 hours.  Chemistry Recent Labs  Lab 10/07/2020 0704 09/09/2020 0722  NA 135 137  K 3.7 3.6  CL 108  --   CO2 16*  --   GLUCOSE 537*  --   BUN 25*  --   CREATININE 1.90*  --   CALCIUM 8.1*  --   GFRNONAA 28*  --   ANIONGAP 11  --     No results for input(s): PROT, ALBUMIN, AST, ALT, ALKPHOS, BILITOT in the last 168 hours. Hematology Recent Labs  Lab 10/04/2020 0704 09/28/2020 0722  WBC 8.7  --   RBC 3.15*  --   HGB 9.6* 10.2*  HCT 31.5* 30.0*  MCV 100.0  --   MCH 30.5  --   MCHC 30.5  --   RDW 13.0  --   PLT 463*  --    BNP Recent Labs  Lab 10/02/2020 0704  BNP 1,649.4*    DDimer No results for input(s): DDIMER in the last 168 hours.  Radiology/Studies:  DG Chest Portable 1 View  Result Date:  09/28/2020 CLINICAL DATA:  70 year old female with respiratory distress. Recently diagnosed with CHF. Hyperglycemic on presentation.  EXAM: PORTABLE CHEST 1 VIEW COMPARISON:  None. FINDINGS: Portable AP semi upright view at 0709 hours. Diffuse and basilar predominant increased pulmonary interstitium with trace fluid in the right minor fissure and veiling opacity at the right lung base. Evidence of mild cardiomegaly. Other mediastinal contours are within normal limits. Visualized tracheal air column is within normal limits. No pneumothorax or consolidation. Negative visible bowel gas pattern. No acute osseous abnormality identified. IMPRESSION: Acute pulmonary edema with probable small right pleural effusion. Mild cardiomegaly. Electronically Signed   By: Genevie Ann M.D.   On: 10/06/2020 07:29    Assessment and Plan:   1.  Acute on chronic systolic heart failure, EF 40-45%/ischemic cardiomyopathy -She presents with an acute systolic heart failure.  She has a known history of systolic heart failure secondary to three-vessel CAD.  Apparently she followed in [place].  She was diagnosed in July 2021.  I did review her hospitalization where she was found to have new onset heart failure.  I also reviewed her cardiology note available in care everywhere that took place on 02/12/2020. -She apparently is intolerant of beta-blockers due to bradycardia.  ACE/ARB/Arni/MRA have been held due to significant CKD. -She was scheduled to see a surgeon as an outpatient.  She apparently did not follow through with this as she was concerned about coronavirus. -As part of her work-up she underwent left heart catheterization that showed severe three-vessel CAD as detailed above.  She also had a cardiac MRI that showed viable myocardium.  Her most recent echo by EF was 40 to 45%.  Of note it was 51% on MRI. -She presents again in decompensated heart failure.  I feel her heart function is likely worse.  She has several alarming  features on exam including tachycardia and PVCs. -I do agree with aggressive diuresis.  She is here with an AKI and severe hyperglycemia and acidosis that is being worked up by the primary team.  Her kidney function will be quite problematic in treating her heart failure as well as sending her ultimately to surgery.  She informs me she would like to get back to normal and then go back to [place] for further care. -In the meantime we will continue 80 mg IV Lasix twice daily. -We will hold beta-blocker given history of bradycardia.  I am more concerned about her tachycardia.  This could represent severely reduced LV function.  We will get an echocardiogram and then try to titrate therapies once we know what her EF is. -Given her kidney function she cannot be on ACE/ARB/Arni/MRA.  We will give her hydralazine 25 mg 3 times daily and Imdur 30 mg daily for afterload reduction. -We will also repeat an echocardiogram.  She has a noticeable systolic murmur.  Aortic stenosis mention is mild to moderate in [place].  It may be greater than that. -Overall she expresses desire to get better and go back to [place].  Hopefully she will be this.  2.  Elevated troponin/demand ischemia in the setting of systolic heart failure versus non-STEMI/Severe 3-vessel CAD -Known three-vessel CAD as detailed above.  Viable myocardium.  Was being worked up for CABG evaluation.  She did not go through with this.  She was concerned about coronavirus. -Would recommend to continue her aspirin and Plavix.  She was on these before.  We will proceed with heparin drip for 48 hours and treat her medically.  She describes no chest pain.   -We will trend troponins and follow-up her echocardiogram.  She may  have had an event that is secondary to her hyperglycemia and acute systolic heart failure. -Follow-up echocardiogram. -Of note when she was admitted in July 2021 her troponin elevation was similar to the values obtained now. -She is  on pravastatin and Zetia.  Apparently intolerant of high intensity statins.  Likely needs a PCSK9 inhibitor.  We will recheck lipid profile. -Holding beta-blocker due to tachycardia and concerns for severely reduced LVEF.  3.  Mild to moderate aortic stenosis -Murmur more consistent with moderate to severe aortic stenosis.  Repeat echocardiogram.  This could be low flow low gradient aortic stenosis.  4.  Acidosis/hyperglycemia -Does not appear to be DKA.  Work-up per primary team.  I will send a lactic acid.  5. Acute on Chronic CKD -Baseline creatinine and results obtained from [place] demonstrated creatinine 1.51.  Suspect this is elevated in the setting of acute congestion.  Hopefully this will improve with diuresis. -Strict I's and O's and close monitoring of electrolytes.  For questions or updates, please contact [place] Please consult [email or web address] for contact info under   Signed, [place] Bells T. Audie Box, MD, [place]  10/08/2020 12:52 PM

## 2020-09-20 NOTE — H&P (Addendum)
Date: 09/30/2020               Patient Name:  Sara [name] MRN: [id number]  DOB: 05/09/1951 Age / Sex: 70 y.o., female   PCP: Joyice Faster, MD         Medical Service: Internal Medicine Teaching Service         Attending Physician: Dr. Evette Doffing, Mallie Mussel, *    First Contact: Dr. Allyson Sabal Pager: [phone]  Second Contact: Dr. Court Joy Pager: [phone]       After Hours (After 5p/  First Contact Pager: [phone]  weekends / holidays): Second Contact Pager: [phone]   Chief Complaint: shortness of breath  History of Present Illness:   Sara [name] is a 70 year old person with history of congestive heart failure (diagnosed July 2021), hypertension, type 2 diabetes mellitus, and prior right-sided CVA (2018) presenting with worsening shortness of breath over the past couple of weeks.   She states that over the past two weeks, she has noticed that she gets short of breath more easily and that she has been having to take smaller steps when she walks. She also states that she has been unable to lie flat on her bed as she will become short of breath and that she has been having increased leg swelling. She notes that these symptoms have been gradually worsening up until yesterday night when she could not tolerate them anymore, which prompted her to call EMS. EMS noted patient's oxygen saturations at 79% on room air and she appeared in respiratory distress. Started patient on BiPAP with subsequent improvement in O2 sats and brought to [facility].  She does report an associated cough that is dry but productive of small amounts of clear sputum. She was diagnosed with heart failure in July 2021 and sees a cardiologist in [facility]). She does take lasix at home, although unsure of what dose she is on.  She does report an episode of chills a couple of weeks ago. Denies headache, fevers, ringing in ears, N/V, chest pain, palpitations, abdominal pain, diarrhea, dysuria, burning with  urination. Feels like left arm is stiff, but this is a chronic issue.  Reports that yesterday, they had a party where she had a burger and cake, but did not take her home diabetic medications.  ED Course: CBC with Hgb 9.6, BMP with bicarb 16, glucose 537, anion gap 11, BUN 25, Cr 1.9. Elevated BNP at 1,649. Troponin 68 > 318. VBG with pH 7.22 / pCO2 44.5 / bicarb 18.3.   Past Medical History: CHF HTN T2DM R-sided CVA (2018) - reports some residual slurred speech, but no motor deficits  Surgical History: -Carotid Endarterectomy   Medications - unsure of dosages:  -lasix 50mg  -norvasc 10mg  daily -pravastatin 40mg  daily -zetia 10mg  daily -bayer aspirin 81mg  daily -clopidogrel 75mg  daily -glipizide 10mg  BID -janumet 100-1000mg  (half tablet) daily -vitamin D3 1000 units three times weekly  Allergies: -Naproxen - nausea and vomiting -Imitrex -"some statins"  Family History:  -Mother - lung cancer -Father - hypertension -Sister - heart disease -Another sister - breast cancer -Aunt - breast cancer  Social History:  -Lives with daughter in [place] -About a 25 pack-year cigarette smoking history -No alcohol or drug use  Review of Systems: A complete ROS was negative except as per HPI.   Physical Exam: Blood pressure (!) 146/84, pulse (!) 102, resp. rate (!) 21, SpO2 99 %. General: elderly female, lying in bed, NAD. HENT: NCAT, mucous membranes  pink and moist. CV: tachycardic rate and normal rhythm, +systolic murmur - chronic (since childhood). Pulm: bilateral crackles up to mid-back, not in respiratory distress. Abdomen: soft, non-distended, nontender to palpation, normoactive bowel sounds. MSK: 2+ bilateral lower extremity pitting edema up to mid-shins. Skin: warm and dry. Neuro: AAOx4, no focal deficits noted.  EKG: personally reviewed my interpretation is sinus tachycardia.  DG Chest Portable 1 View  Result Date: 09/16/2020 CLINICAL DATA:  70 year old female  with respiratory distress. Recently diagnosed with CHF. Hyperglycemic on presentation. EXAM: PORTABLE CHEST 1 VIEW COMPARISON:  None. FINDINGS: Portable AP semi upright view at 0709 hours. Diffuse and basilar predominant increased pulmonary interstitium with trace fluid in the right minor fissure and veiling opacity at the right lung base. Evidence of mild cardiomegaly. Other mediastinal contours are within normal limits. Visualized tracheal air column is within normal limits. No pneumothorax or consolidation. Negative visible bowel gas pattern. No acute osseous abnormality identified. IMPRESSION: Acute pulmonary edema with probable small right pleural effusion. Mild cardiomegaly. Electronically Signed   By: Genevie Ann M.D.   On: 09/09/2020 07:29    Assessment & Plan by Problem: Active Problems:   Acute on chronic heart failure (HCC)  Sara [name] is a 70 year old person with history of CHF, HTN, T2DM, and prior CVA (2018) presenting with shortness of breath and hypoxia, admitted for acute on chronic heart failure exacerbation.  Acute on chronic heart failure exacerbation Patient coming in with worsening orthopnea, dyspnea, and LE edema with significantly elevated BNP and CXR showing pulmonary edema and probable small R pleural effusion. She is volume overloaded on exam with bilateral crackles up to mid-back and bilateral pitting edema up to mid-shins. She is on lasix 50mg  daily at home since her diagnosis in July 2021. Given IV lasix 40mg  in the ED. -starting IV lasix 80mg  BID -f/u ECHO -strict I/O's, daily standing weights -PT/OT evaluation -O2 supplementation to maintain sats >92%  T2DM Uncontrolled as per patient (last HbA1c was "very high"). On janumet and glipizide at home. In the ED, glucose 537 and bicarb 16 on BMP, but no anion gap so unlikely that this is DKA. Given 8 units novolog once. This elevated glucose is likely due to missing home diabetic medications and having a big meal  yesterday. -starting moderate SSI and 3 units with meals TID -f/u HbA1c -trend CBGs, if continue to be elevated will add on basal coverage tomorrow  Acute on chronic kidney disease Stage IIIb Patient's baseline Cr in care everywhere appears to be ~1.5 with GFR ~41. On admission, Cr 1.90. Will trend BMPs to monitor kidney function and electrolytes, especially in the setting of diuresis. Hopeful that this will improve after offloading volume. -avoid nephrotoxic agents -trend BMPs  Elevated troponins from demand ischemia in the setting of systolic HF vs NSTEMI HS troponins 68 > 318. Not complaining of chest pain. Consulted cardiology as per current [facility] Chest Pain Algorithm.   Hypertension - chronic and stable On norvasc 10mg  daily, will continue for BP control.  CVA (2018) - Stable Reports some residual slurred speech, but no residual motor deficits. On bayer aspirin 81mg  and plavix daily.  Dispo: Admit patient to Inpatient with expected length of stay greater than 2 midnights.  Signed: Virl Axe, MD 10/01/2020, 12:38 PM  Pager: [phone] After 5pm on weekdays and 1pm on weekends: On Call pager: [phone]

## 2020-09-20 NOTE — Progress Notes (Signed)
Date and time results received: 09/11/2020 15:50  Test: Lactic Acid Critical Value: 2.4  Name of Provider Notified: Virl Axe MD  Orders Received? No orders received at this time.

## 2020-09-20 NOTE — Progress Notes (Signed)
[place] for Heparin Indication: chest pain/ACS  Allergies  Allergen Reactions  . Naproxen Nausea And Vomiting    Patient Measurements: Height: 5\' 2"  (157.5 cm) Weight: 64 kg (141 lb 1.5 oz) IBW/kg (Calculated) : 50.1 Heparin Dosing Weight: 63 kg  Vital Signs: Temp: 98.8 F (37.1 C) (03/13 1936) Temp Source: Oral (03/13 1936) BP: 123/53 (03/13 2254) Pulse Rate: 96 (03/13 1936)  Labs: Recent Labs    09/18/2020 0704 09/27/2020 0722 09/13/2020 0926 09/14/2020 1343 09/08/2020 1608 09/27/2020 2210  HGB 9.6* 10.2*  --  9.8*  --   --   HCT 31.5* 30.0*  --  30.0*  --   --   PLT 463*  --   --  409*  --   --   HEPARINUNFRC  --   --   --   --   --  0.40  CREATININE 1.90*  --   --   --   --   --   TROPONINIHS 68*  --    < > 1,109* 1,413* 2,156*   < > = values in this interval not displayed.    Estimated Creatinine Clearance: 24.6 mL/min (A) (by C-G formula based on SCr of 1.9 mg/dL (H)).  Assessment: 70 y.o. female with SOB and elevated troponin for heparin  Goal of Therapy:  Heparin level 0.3-0.7 units/ml Monitor platelets by anticoagulation protocol: Yes   Plan:  Continue Heparin at current rate  Follow-up am labs.   Iris Tatsch, [place] PharmD. BCPS  09/24/2020,11:37 PM

## 2020-09-20 NOTE — ED Provider Notes (Addendum)
[facility] EMERGENCY DEPARTMENT Provider Note   CSN: [id number] Arrival date & time: 09/25/2020  1856     History Chief Complaint  Patient presents with  . Respiratory Distress    Sara [name] is a 70 y.o. female.  HPI   Patient presents to the ED for evaluation of shortness of breath.  Per EMS report patient started to have difficulty with her breathing this morning.  She was pacing around and was feeling short of breath.  Family called EMS.  When EMS arrived patient was noted to have decreased oxygen saturation at 79% on room air.  She appeared to be in respiratory distress and was started on BiPAP.  Patient is having difficulty providing history of right now.  She will only nod her head yes and no but she is denying any pain right now.  She indicates she is feeling better after the EMS treatments.  Past Medical History:  Diagnosis Date  . CHF (congestive heart failure) ([place])   . Diabetes mellitus without complication ([place])     There are no problems to display for this patient.   History reviewed. No pertinent surgical history.   OB History   No obstetric history on file.     History reviewed. No pertinent family history.     Home Medications Prior to Admission medications   Not on File    Allergies    Patient has no allergy information on record.  Review of Systems   Review of Systems  All other systems reviewed and are negative.   Physical Exam Updated Vital Signs BP (!) 150/73   Pulse 88   Resp 15   SpO2 100%   Physical Exam Vitals and nursing note reviewed.  Constitutional:      Appearance: She is well-developed.     Comments: somnolent  HENT:     Head: Normocephalic and atraumatic.     Right Ear: External ear normal.     Left Ear: External ear normal.  Eyes:     General: No scleral icterus.       Right eye: No discharge.        Left eye: No discharge.     Conjunctiva/sclera: Conjunctivae normal.  Neck:     Trachea:  No tracheal deviation.  Cardiovascular:     Rate and Rhythm: Normal rate and regular rhythm.  Pulmonary:     Breath sounds: Normal breath sounds. No stridor. No wheezing or rales.  Abdominal:     General: Bowel sounds are normal. There is no distension.     Palpations: Abdomen is soft.     Tenderness: There is no abdominal tenderness. There is no guarding or rebound.  Musculoskeletal:        General: No tenderness.     Cervical back: Neck supple.     Right lower leg: Edema present.     Left lower leg: Edema present.  Skin:    General: Skin is warm and dry.     Findings: No rash.  Neurological:     Cranial Nerves: No cranial nerve deficit (no facial droop, extraocular movements intact, no slurred speech).     Sensory: No sensory deficit.     Motor: No abnormal muscle tone or seizure activity.     Coordination: Coordination normal.     Comments: Patient does follow commands will nod her head yes and no to questions, appears generally weak     ED Results / Procedures / Treatments   Labs (  all labs ordered are listed, but only abnormal results are displayed) Labs Reviewed  BASIC METABOLIC PANEL - Abnormal; Notable for the following components:      Result Value   CO2 16 (*)    Glucose, Bld 537 (*)    BUN 25 (*)    Creatinine, Ser 1.90 (*)    Calcium 8.1 (*)    GFR, Estimated 28 (*)    All other components within normal limits  CBC - Abnormal; Notable for the following components:   RBC 3.15 (*)    Hemoglobin 9.6 (*)    HCT 31.5 (*)    Platelets 463 (*)    All other components within normal limits  BRAIN NATRIURETIC PEPTIDE - Abnormal; Notable for the following components:   B Natriuretic Peptide 1,649.4 (*)    All other components within normal limits  I-STAT VENOUS BLOOD GAS, ED - Abnormal; Notable for the following components:   pH, Ven 7.222 (*)    pO2, Ven 132.0 (*)    Bicarbonate 18.3 (*)    TCO2 20 (*)    Acid-base deficit 9.0 (*)    Calcium, Ion 1.08 (*)     HCT 30.0 (*)    Hemoglobin 10.2 (*)    All other components within normal limits  TROPONIN I (HIGH SENSITIVITY) - Abnormal; Notable for the following components:   Troponin I (High Sensitivity) 68 (*)    All other components within normal limits  RESP PANEL BY RT-PCR (FLU A&B, COVID) [id number]  TROPONIN I (HIGH SENSITIVITY)    EKG EKG Interpretation  Date/Time:  Sunday September 20 2020 06:59:55 EDT Ventricular Rate:  99 PR Interval:    QRS Duration: 91 QT Interval:  370 QTC Calculation: 475 R Axis:   65 Text Interpretation: Sinus tachycardia Multiple premature complexes, vent & supraven Biatrial enlargement Probable LVH with secondary repol abnrm No old tracing to compare Confirmed by Dorie Rank [phone]) on 10/04/2020 7:05:51 AM   Radiology DG Chest Portable 1 View  Result Date: 10/01/2020 CLINICAL DATA:  70 year old female with respiratory distress. Recently diagnosed with CHF. Hyperglycemic on presentation. EXAM: PORTABLE CHEST 1 VIEW COMPARISON:  None. FINDINGS: Portable AP semi upright view at 0709 hours. Diffuse and basilar predominant increased pulmonary interstitium with trace fluid in the right minor fissure and veiling opacity at the right lung base. Evidence of mild cardiomegaly. Other mediastinal contours are within normal limits. Visualized tracheal air column is within normal limits. No pneumothorax or consolidation. Negative visible bowel gas pattern. No acute osseous abnormality identified. IMPRESSION: Acute pulmonary edema with probable small right pleural effusion. Mild cardiomegaly. Electronically Signed   By: Genevie Ann M.D.   On: 10/02/2020 07:29    Procedures .Critical Care Performed by: Dorie Rank, MD Authorized by: Dorie Rank, MD   Critical care provider statement:    Critical care time (minutes):  45   Critical care was time spent personally by me on the following activities:  Discussions with consultants, evaluation of patient's response to treatment, examination of  patient, ordering and performing treatments and interventions, ordering and review of laboratory studies, ordering and review of radiographic studies, pulse oximetry, re-evaluation of patient's condition, obtaining history from patient or surrogate and review of old charts     Medications Ordered in ED Medications  furosemide (LASIX) injection 40 mg (has no administration in time range)  nitroGLYCERIN (NITROGLYN) 2 % ointment 1 inch (has no administration in time range)  insulin aspart (novoLOG) injection 8 Units (8 Units Subcutaneous Given  10/07/2020 0569)    ED Course  I have reviewed the triage vital signs and the nursing notes.  Pertinent labs & imaging results that were available during my care of the patient were reviewed by me and considered in my medical decision making (see chart for details).  Clinical Course as of 09/23/2020 0925  Sun Sep 20, 2020  0747 ABG ordered but patient would not allow it to be drawn [JK]  0811 Blood sugar elevated at 537.  Bicarb also decreased at 16.Georgiann Hahn gap is not elevated [JK]    Clinical Course User Index [JK] Dorie Rank, MD   MDM Rules/Calculators/A&P                          Patient presented to ED with complaints of shortness of breath.  Patient initially required BiPAP treatment.  She was hypoxic after initial assessment by EMS.  Patient has responded to treatment in the ED.  She is now breathing more comfortably.  She is awake alert and able to answer questions.  She still on BiPAP but her oxygen saturation is 100% and she is not having any respiratory difficulty.  Her work-up is consistent with a CHF exacerbation.  She has an elevated BNP as well as slight elevated troponin level.  No signs of any definite ischemia on EKG.  We will continue to monitor her troponins.  I have ordered IV Lasix as well as nitroglycerin paste as patient is mildly hypertensive.  Patient also has evidence of hyperglycemia and metabolic acidosis but it is a nonanion  gap acidosis.  This is not suggestive of DKA.  I have ordered insulin but at this time we will not start her on DKA protocol.  Patient does have decreased GFR and renal insufficiency.  It is possible her metabolic acidosis is related to direct renal etiology.   We will try to see if we can wean her off BiPAP.  I will consult with the medical service for admission and further treatment.   Final Clinical Impression(s) / ED Diagnoses Final diagnoses:  Acute congestive heart failure, unspecified heart failure type ([place])  Metabolic acidemia, unspecified  Hyperglycemia      Dorie Rank, MD 09/14/2020 [phone]   Notified 2nd troponin has increased.  Pt is without chest pain.  Asa ordered.  Pt is being admitted by medical service.  Remains stable.   Dorie Rank, MD 09/19/2020 1115

## 2020-09-20 NOTE — Progress Notes (Signed)
[place] for Heparin Indication: chest pain/ACS  Allergies  Allergen Reactions  . Naproxen Nausea And Vomiting    Patient Measurements: Height: 5\' 2"  (157.5 cm) Weight: 64 kg (141 lb 1.5 oz) IBW/kg (Calculated) : 50.1 Heparin Dosing Weight: 63 kg  Vital Signs: BP: 143/66 (03/13 1215) Pulse Rate: 94 (03/13 1215)  Labs: Recent Labs    09/12/2020 0704 09/26/2020 0722 09/24/2020 0926  HGB 9.6* 10.2*  --   HCT 31.5* 30.0*  --   PLT 463*  --   --   CREATININE 1.90*  --   --   TROPONINIHS 68*  --  318*    Estimated Creatinine Clearance: 24.6 mL/min (A) (by C-G formula based on SCr of 1.9 mg/dL (H)).   Medical History: Past Medical History:  Diagnosis Date  . CHF (congestive heart failure) ([place])   . Diabetes mellitus without complication ([place])   . Hypertension   . Stroke [facility]) 2018   Reports right sided stroke, continues to have some slurred speech, but no motor defecients     Medications:  Scheduled:  . amLODipine  10 mg Oral Daily  . [START ON 09/21/2020] aspirin EC  81 mg Oral Daily  . clopidogrel  75 mg Oral Daily  . ezetimibe  10 mg Oral Daily  . furosemide  80 mg Intravenous BID  . heparin  2,000 Units Intravenous Once  . hydrALAZINE  25 mg Oral Q8H  . insulin aspart  0-15 Units Subcutaneous TID WC  . insulin aspart  0-5 Units Subcutaneous QHS  . insulin aspart  3 Units Subcutaneous TID WC  . isosorbide mononitrate  30 mg Oral Daily  . pravastatin  40 mg Oral Daily  . sodium chloride flush  3 mL Intravenous Q12H    Assessment: Patient is a 50 yof that is being admitted for Sob. With a mix presentation due to an elevation in her trop levels concerning for ACS vs demand ischemia from CHF. Pharmacy has been asked to dose heparin at this time for ACS.  Goal of Therapy:  Heparin level 0.3-0.7 units/ml Monitor platelets by anticoagulation protocol: Yes   Plan:  - Heparin bolus 2000 units IV x 1 dose (half load to 30mg   lovenox given ~ 1.5 hours ago  - Heparin drip @ 750 units/hr - Heparin level in ~ 8 hours  - Monitor patient for s/s of bleeding and CBC while on heparin   Duanne Limerick PharmD. BCPS  09/11/2020,1:40 PM

## 2020-09-20 NOTE — ED Notes (Signed)
Critical Glucose result 537 Knapp MD and Selena Batten RN notified

## 2020-09-20 NOTE — Progress Notes (Addendum)
ABG attempted. Patient moved arm and said to stop. She then refused ABG. Dorie Rank, MD notified.

## 2020-09-20 NOTE — ED Triage Notes (Signed)
Pt coming from home with respiratory distress. Pt has a recent CHF diagnosis (several months ago). Family called stating pt was pacing and had SOB. Pt CGB 489 PTA; arrives on cpap. O2 sat 79% on RA in ED.

## 2020-09-21 ENCOUNTER — Inpatient Hospital Stay (HOSPITAL_COMMUNITY): Payer: Medicare Other

## 2020-09-21 DIAGNOSIS — E785 Hyperlipidemia, unspecified: Secondary | ICD-10-CM

## 2020-09-21 DIAGNOSIS — I5023 Acute on chronic systolic (congestive) heart failure: Secondary | ICD-10-CM

## 2020-09-21 DIAGNOSIS — Z7984 Long term (current) use of oral hypoglycemic drugs: Secondary | ICD-10-CM

## 2020-09-21 DIAGNOSIS — I214 Non-ST elevation (NSTEMI) myocardial infarction: Secondary | ICD-10-CM | POA: Diagnosis present

## 2020-09-21 DIAGNOSIS — I25118 Atherosclerotic heart disease of native coronary artery with other forms of angina pectoris: Secondary | ICD-10-CM | POA: Diagnosis not present

## 2020-09-21 DIAGNOSIS — I10 Essential (primary) hypertension: Secondary | ICD-10-CM | POA: Diagnosis not present

## 2020-09-21 DIAGNOSIS — E872 Acidosis, unspecified: Secondary | ICD-10-CM

## 2020-09-21 DIAGNOSIS — E119 Type 2 diabetes mellitus without complications: Secondary | ICD-10-CM | POA: Diagnosis not present

## 2020-09-21 DIAGNOSIS — I509 Heart failure, unspecified: Secondary | ICD-10-CM

## 2020-09-21 DIAGNOSIS — N183 Chronic kidney disease, stage 3 unspecified: Secondary | ICD-10-CM | POA: Diagnosis present

## 2020-09-21 DIAGNOSIS — N1832 Chronic kidney disease, stage 3b: Secondary | ICD-10-CM | POA: Diagnosis not present

## 2020-09-21 LAB — CBC
HCT: 28.2 % — ABNORMAL LOW (ref 36.0–46.0)
Hemoglobin: 9 g/dL — ABNORMAL LOW (ref 12.0–15.0)
MCH: 30 pg (ref 26.0–34.0)
MCHC: 31.9 g/dL (ref 30.0–36.0)
MCV: 94 fL (ref 80.0–100.0)
Platelets: 355 10*3/uL (ref 150–400)
RBC: 3 MIL/uL — ABNORMAL LOW (ref 3.87–5.11)
RDW: 12.9 % (ref 11.5–15.5)
WBC: 7.5 10*3/uL (ref 4.0–10.5)
nRBC: 0 % (ref 0.0–0.2)

## 2020-09-21 LAB — ECHOCARDIOGRAM COMPLETE
AR max vel: 0.87 cm2
AV Area VTI: 0.89 cm2
AV Area mean vel: 0.86 cm2
AV Mean grad: 11.8 mmHg
AV Peak grad: 20.6 mmHg
Ao pk vel: 2.27 m/s
Area-P 1/2: 6.48 cm2
Height: 62 in
S' Lateral: 3.5 cm
Weight: 2291.2 oz

## 2020-09-21 LAB — BASIC METABOLIC PANEL
Anion gap: 7 (ref 5–15)
BUN: 28 mg/dL — ABNORMAL HIGH (ref 8–23)
CO2: 22 mmol/L (ref 22–32)
Calcium: 8.2 mg/dL — ABNORMAL LOW (ref 8.9–10.3)
Chloride: 110 mmol/L (ref 98–111)
Creatinine, Ser: 1.74 mg/dL — ABNORMAL HIGH (ref 0.44–1.00)
GFR, Estimated: 31 mL/min — ABNORMAL LOW (ref >60)
Glucose, Bld: 130 mg/dL — ABNORMAL HIGH (ref 70–99)
Potassium: 3.6 mmol/L (ref 3.5–5.1)
Sodium: 139 mmol/L (ref 135–145)

## 2020-09-21 LAB — TROPONIN I (HIGH SENSITIVITY)
Troponin I (High Sensitivity): 1643 ng/L (ref <18)
Troponin I (High Sensitivity): 1826 ng/L (ref <18)

## 2020-09-21 LAB — LIPID PANEL
Cholesterol: 253 mg/dL — ABNORMAL HIGH (ref 0–200)
HDL: 66 mg/dL (ref >40)
LDL Cholesterol: 177 mg/dL — ABNORMAL HIGH (ref 0–99)
Total CHOL/HDL Ratio: 3.8 RATIO
Triglycerides: 52 mg/dL (ref <150)
VLDL: 10 mg/dL (ref 0–40)

## 2020-09-21 LAB — GLUCOSE, CAPILLARY
Glucose-Capillary: 162 mg/dL — ABNORMAL HIGH (ref 70–99)
Glucose-Capillary: 143 mg/dL — ABNORMAL HIGH (ref 70–99)
Glucose-Capillary: 253 mg/dL — ABNORMAL HIGH (ref 70–99)
Glucose-Capillary: 105 mg/dL — ABNORMAL HIGH (ref 70–99)

## 2020-09-21 LAB — MAGNESIUM: Magnesium: 1.9 mg/dL (ref 1.7–2.4)

## 2020-09-21 LAB — HEPARIN LEVEL (UNFRACTIONATED): Heparin Unfractionated: 0.31 IU/mL (ref 0.30–0.70)

## 2020-09-21 MED ORDER — PERFLUTREN LIPID MICROSPHERE
1.0000 mL | INTRAVENOUS | Status: AC | PRN
  Administered 2020-09-21: 2 mL via INTRAVENOUS
  Filled 2020-09-21: qty 10

## 2020-09-21 MED ORDER — POTASSIUM CHLORIDE CRYS ER 20 MEQ PO TBCR
40.0000 meq | EXTENDED_RELEASE_TABLET | Freq: Two times a day (BID) | ORAL | Status: DC
  Administered 2020-09-21 – 2020-09-22 (×4): 40 meq via ORAL
  Filled 2020-09-21 (×4): qty 2

## 2020-09-21 MED ORDER — ENSURE ENLIVE PO LIQD
237.0000 mL | Freq: Two times a day (BID) | ORAL | Status: DC
  Administered 2020-09-21 – 2020-09-26 (×4): 237 mL via ORAL

## 2020-09-21 MED ORDER — METOLAZONE 5 MG PO TABS
2.5000 mg | ORAL_TABLET | Freq: Every day | ORAL | Status: DC
  Administered 2020-09-21 – 2020-09-23 (×3): 2.5 mg via ORAL
  Filled 2020-09-21 (×3): qty 1

## 2020-09-21 MED ORDER — ATORVASTATIN CALCIUM 80 MG PO TABS
80.0000 mg | ORAL_TABLET | Freq: Every day | ORAL | Status: DC
  Administered 2020-09-21 – 2020-09-30 (×10): 80 mg via ORAL
  Filled 2020-09-21 (×11): qty 1

## 2020-09-21 NOTE — Evaluation (Signed)
Physical Therapy Evaluation & Discharge Patient Details Name: Sara [name] MRN: [id number] DOB: 11-Feb-1951 Today's Date: 09/21/2020   History of Present Illness  Pt is a 70 y.o. female admitted 09/21/2020 with worsening SOB, orthopnea, BLE edema. Workup for decompensated systolic HF, sinus tachycardia, frequent PVCs. PMH includes HF, DM2, CKD III, HTN, CVA (2018), CAD (has 3-vessel disease; did not go to suggested follow-up for CABG consult).    Clinical Impression  Patient evaluated by Physical Therapy with no further acute PT needs identified. PTA, pt independent, lives with daughter; endorses some increased difficulty with mobility due to SOB. Today, pt independent with activity and ADL tasks; session focused on educ and strategies for pt to self-monitor activity tolerance and DOE with activity, including ambulation. Educ re: energy conservation strategies, activity recommendations. All education has been completed and the patient has no further questions. Acute PT is signing off. Thank you for this referral.  SpO2 97% on RA with ambulation HR up to 118    Follow Up Recommendations No PT follow up    Equipment Recommendations  None recommended by PT    Recommendations for Other Services       Precautions / Restrictions Precautions Precautions: Fall Restrictions Weight Bearing Restrictions: No      Mobility  Bed Mobility Overal bed mobility: Modified Independent             General bed mobility comments: HOB elevated    Transfers Overall transfer level: Independent Equipment used: None                Ambulation/Gait Ambulation/Gait assistance: Modified independent (Device/Increase time) Gait Distance (Feet): 350 Feet Assistive device: None;IV Pole Gait Pattern/deviations: Step-through pattern;Decreased stride length Gait velocity: Decreased   General Gait Details: Slow, steady gait indep with and without pushing IV pole; intermittent standing rest breaks  due to SOB; pt with good ability to self-monitor activity tolerance and perform pursed lip breathing without cues; DOE up to 3/4 (likely exacerbated by pt conversing and wearing face mask while walking)  Stairs            Wheelchair Mobility    Modified Rankin (Stroke Patients Only)       Balance Overall balance assessment: Independent   Sitting balance-Leahy Scale: Good       Standing balance-Leahy Scale: Good               High level balance activites: Side stepping;Backward walking;Direction changes;Turns;Sudden stops;Head turns High Level Balance Comments: No overt instability or LOB with higher level balance tasks             Pertinent Vitals/Pain Pain Assessment: No/denies pain    Home Living Family/patient expects to be discharged to:: Private residence Living Arrangements: Children Available Help at Discharge: Family;Available PRN/intermittently Type of Home: House Home Access: Level entry     Home Layout: Two level Home Equipment: Crutches;Walker - 2 wheels Additional Comments: Pt resides in [place] with her daughter. Was in [place] visiting son and daughter-in-law    Prior Function Level of Independence: Independent         Comments: Has had some increased difficulty with mobility due to SOB, but determined to remain independent. Enjoys gardening     Hand Dominance   Dominant Hand: Right    Extremity/Trunk Assessment   Upper Extremity Assessment Upper Extremity Assessment: Overall WFL for tasks assessed    Lower Extremity Assessment Lower Extremity Assessment: Overall WFL for tasks assessed    Cervical / Trunk Assessment Cervical /  Trunk Assessment: Normal  Communication   Communication: HOH (slight)  Cognition Arousal/Alertness: Awake/alert Behavior During Therapy: WFL for tasks assessed/performed Overall Cognitive Status: Within Functional Limits for tasks assessed                                         General Comments General comments (skin integrity, edema, etc.): SpO2 97-98% on RA with ambulation, HR 118. Educ re: activity recommendations, energy conservation strategies (handout provided)    Exercises     Assessment/Plan    PT Assessment Patent does not need any further PT services  PT Problem List         PT Treatment Interventions      PT Goals (Current goals can be found in the Care Plan section)  Acute Rehab PT Goals Patient Stated Goal: To return home. PT Goal Formulation: All assessment and education complete, DC therapy    Frequency     Barriers to discharge        Co-evaluation               AM-PAC PT "6 Clicks" Mobility  Outcome Measure Help needed turning from your back to your side while in a flat bed without using bedrails?: None Help needed moving from lying on your back to sitting on the side of a flat bed without using bedrails?: None Help needed moving to and from a bed to a chair (including a wheelchair)?: None Help needed standing up from a chair using your arms (e.g., wheelchair or bedside chair)?: None Help needed to walk in hospital room?: None Help needed climbing 3-5 steps with a railing? : A Little 6 Click Score: 23    End of Session Equipment Utilized During Treatment: Gait belt Activity Tolerance: Patient tolerated treatment well Patient left: in chair;with call bell/phone within reach;with nursing/sitter in room Nurse Communication: Mobility status PT Visit Diagnosis: Other abnormalities of gait and mobility (R26.89)    Time: 6203-5597 PT Time Calculation (min) (ACUTE ONLY): 25 min   Charges:   PT Evaluation $PT Eval Moderate Complexity: 1 Mod PT Treatments $Therapeutic Exercise: 8-22 mins       Mabeline Caras, PT, DPT Acute Rehabilitation Services  Pager [phone] Office [place] 09/21/2020, 12:47 PM

## 2020-09-21 NOTE — Progress Notes (Signed)
  Echocardiogram 2D Echocardiogram with definity and 3D has been performed.  Darlina Sicilian M 09/21/2020, 9:31 AM

## 2020-09-21 NOTE — Progress Notes (Signed)
[place] for Heparin Indication: chest pain/ACS  Allergies  Allergen Reactions  . Naproxen Nausea And Vomiting    Patient Measurements: Height: 5\' 2"  (157.5 cm) Weight: 65 kg (143 lb 3.2 oz) IBW/kg (Calculated) : 50.1 Heparin Dosing Weight: 63 kg  Vital Signs: Temp: 98.3 F (36.8 C) (03/14 0907) Temp Source: Oral (03/14 0907) BP: 132/68 (03/14 0907) Pulse Rate: 68 (03/14 0907)  Labs: Recent Labs    09/10/2020 0704 09/21/2020 0722 10/01/2020 0926 10/02/2020 1343 09/21/2020 1608 10/04/2020 2210 09/21/20 0251 09/21/20 0636 09/21/20 0832  HGB 9.6* 10.2*  --  9.8*  --   --  9.0*  --   --   HCT 31.5* 30.0*  --  30.0*  --   --  28.2*  --   --   PLT 463*  --   --  409*  --   --  355  --   --   HEPARINUNFRC  --   --   --   --   --  0.40 0.31  --   --   CREATININE 1.90*  --   --   --   --   --  1.74*  --   --   TROPONINIHS 68*  --    < > 1,109*   < > 2,156*  --  1,643* 1,826*   < > = values in this interval not displayed.    Estimated Creatinine Clearance: 27 mL/min (A) (by C-G formula based on SCr of 1.74 mg/dL (H)).  Assessment: 70 y.o. female with SOB and elevated troponin on heparin. She is noted with multivessel CAD on cath in 2021.  Plans are to continue heparin for 48 hours -heparin level 0.31 -hg= 9  Goal of Therapy:  Heparin level 0.3-0.7 units/ml Monitor platelets by anticoagulation protocol: Yes   Plan:  No heparin changes needed Daily heparin level and CBC Anticipate d/c heparin on 3/15  Hildred Laser, PharmD Clinical Pharmacist **Pharmacist phone directory can now be found on [email or web address] ([email or web address]).  Listed under [place].

## 2020-09-21 NOTE — Progress Notes (Signed)
Heart Failure Navigator Progress Note  Assessed for Heart & Vascular TOC clinic readiness.  Unfortunately at this time the patient does not meet criteria due to pt changed plans of returning to [facility] for CABG eval, would like to be evaluated/work up completed this stay (if possible) as she now intends to stay with son and daughter-in-law in [place] and would like to have surgery (if warranted) with [facility] CVTS team.   Primary team and cardiology team made aware of changes to potential plan.   Navigator available for reassessment of patient.   Pricilla Holm, RN, BSN Heart Failure Nurse Navigator [phone]

## 2020-09-21 NOTE — Progress Notes (Signed)
   Subjective:   No acute overnight events.  Reports feeling good today, shortness of breath improving. States that her appointment with her cardiologist in [place] in on 09/24/2020. Near the end of our encounter, she does state that she is scared about what is going to happen.  Objective:  Vital signs in last 24 hours: Vitals:   09/21/20 0023 09/21/20 0449 09/21/20 0537 09/21/20 0907  BP: 125/63 123/74 127/68 132/68  Pulse: 94 100  68  Resp: 16 15  18   Temp: 98.4 F (36.9 C) 98.4 F (36.9 C)  98.3 F (36.8 C)  TempSrc: Oral Oral  Oral  SpO2: 98% 95%  100%  Weight:  65 kg    Height:       Physical Exam: General: elderly female, sitting up in bed, NAD. CV: Sinus tachycardia with trigeminy. Parasternal heave present. Elevated JVP up to level of mandible. Pulm: bilateral crackles up to mid-back. MSK: 1+ pitting edema in bilateral lower extremities to level of knee. Skin: warm and dry. Neuro: mentating appropriately, no focal deficits noted.  Assessment/Plan:  Principal Problem:   Acute on chronic HFrEF (heart failure with reduced ejection fraction) (HCC) Active Problems:   NSTEMI (non-ST elevated myocardial infarction) ([place])   Diabetes mellitus ([place])   Essential hypertension   Hyperlipidemia   CKD (chronic kidney disease), stage III ([place])  This is a 70 year old person with ischemic heart disease, T2DM, HTN, HLD, and CKD stage IIIb admitted for management of acute on chronic heart failure with reduced ejection fraction.  Acute hypoxemic respiratory failure 2/2 acute on chronic HFrEF 2/2 ischemic cardiomyopathy Symptoms progressed to NYHA Class IV symptoms. ECHO today showing EF 25-30%, severely decreased LV function with regional wall motion abnormalities, grade II diastolic dysfunction, and severe akinesis of LV, mid-apical anteroseptal wall, and apical segment. Very volume overloaded on exam. Will continue with aggressive IV diuresis and manage decompensated HF with  medication adjustments. Will continue 48 hours of heparin infusion for medical management of her ischemic event. Cardiology following, greatly appreciate recommendations. Ultimate plan to discharge with medication management and have her follow-up with her cardiologist in [place] to plan for possible bypass surgery in the coming weeks. -IV lasix 80mg  BID -on hydralazine, norvasc, and imdur -O2 supplementation to maintain sats >92%, may need home oxygen at discharge -PT/OT eval  Elevated troponins 2/2 Type II NSTEMI Hx of severe multivessel CAD Likely etiology is due to volume overload and hypoxia. Troponins markedly elevated, but patient without chest pain. Cardiology following, appreciate recs. Continue IV heparin for 48 hours. Continue ASA and plavix. Avoid beta-blockers due to decompensated CHF.   CKD Stage IIIb Baseline Cr ~1.5 with GFR ~41. Today, Cr 1.9 > 1.74. Trending kidney function with daily BMPs, anticipate improvement with diuresis.  Type 2 DM HbA1c 9.3%. On novolog 3 units TID with meals and moderate SSI.  Prolonged QTc EKG with QTc 504 ms, magnesium 1.9. Avoid QTc prolonging medications.  Prior to Admission Living Arrangement: Home Anticipated Discharge Location: TBD Barriers to Discharge: continued medical management Dispo: Anticipated discharge in approximately 1-2 day(s).   Virl Axe, MD 09/21/2020, 1:04 PM Pager: [phone] After 5pm on weekdays and 1pm on weekends: On Call pager [phone]

## 2020-09-21 NOTE — Progress Notes (Signed)
Initial Nutrition Assessment  DOCUMENTATION CODES:   Not applicable  INTERVENTION:  Provide Ensure Enlive po BID, each supplement provides 350 kcal and 20 grams of protein.  Encourage adequate PO intake.  NUTRITION DIAGNOSIS:   Increased nutrient needs related to chronic illness (CHF) as evidenced by estimated needs.  GOAL:   Patient will meet greater than or equal to 90% of their needs  MONITOR:   PO intake,Supplement acceptance,Skin,Weight trends,Labs,I & O's  REASON FOR ASSESSMENT:   Malnutrition Screening Tool    ASSESSMENT:   70 year old person with history of congestive heart failure (diagnosed July 2021), hypertension, type 2 diabetes mellitus, and prior right-sided CVA (2018) presenting with worsening shortness of breath over the past couple of weeks. Pt found to have elevated troponin, most consistent with a type II NSTEMI due to volume overload and hypoxia.  Meal completion has been 80%. Pt reports eating well currently and PTA with usual consumption of at least 2 meals a day with multiple snacks in between. Per MD, pt very volume overloaded and will continue on aggressive diuresis. RD to order nutritional supplements to aid in caloric and protein needs. Pt requesting diet education information on a low sodium diet. Handout "Low Sodium Nutrition Therapy" from the Academy of Nutrition and Dietetics Manual was given. Discussed foods recommended and not recommended. Encouraged whole grains, and fresh fruits and vegetables. Pt reports understanding of information discussed.   Unable to complete Nutrition-Focused physical exam at this time. Pt actively consuming lunch at time of visit.    Labs and medications reviewed.   Diet Order:   Diet Order            Diet heart healthy/carb modified Room service appropriate? Yes; Fluid consistency: Thin  Diet effective now                 EDUCATION NEEDS:   Education needs have been addressed  Skin:  Skin Assessment:  Reviewed RN Assessment  Last BM:  Unknown  Height:   Ht Readings from Last 1 Encounters:  10/08/2020 5\' 2"  (1.575 m)    Weight:   Wt Readings from Last 1 Encounters:  09/21/20 65 kg    BMI:  Body mass index is 26.19 kg/m.  Estimated Nutritional Needs:   Kcal:  1800-2000  Protein:  85-95 grams  Fluid:  1.5 L/day  Corrin Parker, MS, RD, LDN RD pager number/after hours weekend pager number on Amion.

## 2020-09-21 NOTE — Progress Notes (Signed)
Occupational Therapy Evaluation Patient Details Name: Sara [name] MRN: [id number] DOB: [birth year] Today's Date: 09/21/2020    History of Present Illness Pt is a 70 y.o. female admitted 10/07/2020 with worsening SOB, orthopnea, BLE edema. Workup for decompensated systolic HF, sinus tachycardia, frequent PVCs. PMH includes HF, DM2, CKD III, HTN, CVA (2018), CAD (has 3-vessel disease; never follow-up for CABG consult).   Clinical Impression   PTA patient was living with her daughter in a 2-level private residence and was independent with ADLs/IADLs without AD. Patient currently functioning at baseline demonstrating observed ADLs with Mod I and slightly increased time without AD. Patient education on activity pacing and energy conservation in prep for safe return home. Patient expressed verbal understanding. Patient does not require continued acute occupational therapy services with OT to sign off at this time. Patient reports plan for return to [facility] for recommended surgical intervention.     Follow Up Recommendations  No OT follow up    Equipment Recommendations  None recommended by OT    Recommendations for Other Services       Precautions / Restrictions Precautions Precautions: Fall Restrictions Weight Bearing Restrictions: No      Mobility Bed Mobility Overal bed mobility: Modified Independent             General bed mobility comments: Supine <> EOB with Mod I and slighly increased time.    Transfers Overall transfer level: Independent Equipment used: None                  Balance Overall balance assessment: No apparent balance deficits (not formally assessed)                                         ADL either performed or assessed with clinical judgement   ADL Overall ADL's : At baseline                                       General ADL Comments: Demonstrates toilet transfer and hand hygiene standing at sink level  with I.     Vision Baseline Vision/History: Wears glasses Wears Glasses: Reading only Patient Visual Report: No change from baseline Vision Assessment?: No apparent visual deficits     Perception     Praxis      Pertinent Vitals/Pain Pain Assessment: No/denies pain     Hand Dominance Right   Extremity/Trunk Assessment Upper Extremity Assessment Upper Extremity Assessment: Overall WFL for tasks assessed   Lower Extremity Assessment Lower Extremity Assessment: Defer to PT evaluation   Cervical / Trunk Assessment Cervical / Trunk Assessment: Normal   Communication Communication Communication: HOH   Cognition Arousal/Alertness: Awake/alert Behavior During Therapy: WFL for tasks assessed/performed Overall Cognitive Status: Within Functional Limits for tasks assessed                                     General Comments  VSS on RA.    Exercises     Shoulder Instructions      Home Living Family/patient expects to be discharged to:: Private residence Living Arrangements: Children (Daughter (lives in [place])) Available Help at Discharge: Family;Available PRN/intermittently Type of Home: House Home Access: Level entry     Home Layout: Two level  Alternate Level Stairs-Number of Steps: 13 Alternate Level Stairs-Rails: Right Bathroom Shower/Tub: Teacher, early years/pre: Handicapped height     Home Equipment: Crutches;Walker - 2 wheels   Additional Comments: Was visiting son and daughter-in-law      Prior Functioning/Environment Level of Independence: Independent                 OT Problem List: Increased edema      OT Treatment/Interventions:      OT Goals(Current goals can be found in the care plan section) Acute Rehab OT Goals Patient Stated Goal: To return home. OT Goal Formulation: With patient  OT Frequency:     Barriers to D/C:            Co-evaluation              AM-PAC OT "6 Clicks" Daily  Activity     Outcome Measure Help from another person eating meals?: None Help from another person taking care of personal grooming?: None Help from another person toileting, which includes using toliet, bedpan, or urinal?: None Help from another person bathing (including washing, rinsing, drying)?: None Help from another person to put on and taking off regular upper body clothing?: None Help from another person to put on and taking off regular lower body clothing?: None 6 Click Score: 24   End of Session    Activity Tolerance: Patient tolerated treatment well Patient left: in chair;with call bell/phone within reach;with chair alarm set  OT Visit Diagnosis: Muscle weakness (generalized) (M62.81)                Time: 1030-1100 OT Time Calculation (min): 30 min Charges:  OT General Charges $OT Visit: 1 Visit OT Evaluation $OT Eval Low Complexity: 1 Low OT Treatments $Self Care/Home Management : 8-22 mins  Sara [name] [name]. OTR/L Supplemental OT, Department of rehab services [phone]  Sara [name]. 09/21/2020, 11:17 AM

## 2020-09-21 NOTE — Progress Notes (Addendum)
Progress Note  Patient Name: Sara [name] Date of Encounter: 09/21/2020  [facility] Cardiologist: Follows in [place]  Subjective   No acute overnight events. Patient still feels short of breath but better. No chest pain. Occasional palpitations. No lightheadedness or dizziness.  Inpatient Medications    Scheduled Meds: . amLODipine  10 mg Oral Daily  . aspirin EC  81 mg Oral Daily  . atorvastatin  80 mg Oral Daily  . clopidogrel  75 mg Oral Daily  . ezetimibe  10 mg Oral Daily  . furosemide  80 mg Intravenous BID  . hydrALAZINE  25 mg Oral Q8H  . insulin aspart  0-15 Units Subcutaneous TID WC  . insulin aspart  0-5 Units Subcutaneous QHS  . insulin aspart  3 Units Subcutaneous TID WC  . isosorbide mononitrate  30 mg Oral Daily  . sodium chloride flush  3 mL Intravenous Q12H   Continuous Infusions: . sodium chloride    . heparin 750 Units/hr (09/21/20 0600)   PRN Meds: sodium chloride, acetaminophen, ondansetron (ZOFRAN) IV, perflutren lipid microspheres (DEFINITY) IV suspension, sodium chloride flush   Vital Signs    Vitals:   09/21/20 0023 09/21/20 0449 09/21/20 0537 09/21/20 0907  BP: 125/63 123/74 127/68 132/68  Pulse: 94 100  68  Resp: 16 15  18   Temp: 98.4 F (36.9 C) 98.4 F (36.9 C)  98.3 F (36.8 C)  TempSrc: Oral Oral  Oral  SpO2: 98% 95%  100%  Weight:  65 kg    Height:        Intake/Output Summary (Last 24 hours) at 09/21/2020 1009 Last data filed at 09/21/2020 0600 Gross per 24 hour  Intake 51.54 ml  Output 600 ml  Net -548.46 ml   Last 3 Weights 09/21/2020 10/01/2020  Weight (lbs) 143 lb 3.2 oz 141 lb 1.5 oz  Weight (kg) 64.955 kg 64 kg     Telemetry    Sinus rhythm with rates in the 90's to low 100's. Frequent PVCs (often trigeminy pattern) as well as short runs of NSVT (longest run being 6 beats). - Personally Reviewed  ECG    Normal sinus rhythm, rate 93 bpm, with PVCs and non-specific ST/T changes. Normal axis. Prolonged QTc  of 504 ms. - Personally Reviewed  Physical Exam   GEN: No acute distress.   Neck: No JVD. Cardiac: Borderline tachycardic. III/VI systolic murmur. No rubs or gallops. Respiratory: No increased work of breathing. Crackles in bilateral bases. GI: Soft, non-distended, and non-tender.  MS: Trace lower extremity edema. No deformity. Skin: Warm and dry. Neuro:  No focal deficits. Psych: Normal affect. Responds appropriately.   Labs    High Sensitivity Troponin:   Recent Labs  Lab 09/12/2020 1343 09/24/2020 1608 09/16/2020 2210 09/21/20 0636 09/21/20 0832  TROPONINIHS 1,109* 1,413* 2,156* 1,643* 1,826*     Chemistry Recent Labs  Lab 10/05/2020 0704 10/02/2020 0722 09/21/20 0251  NA 135 137 139  K 3.7 3.6 3.6  CL 108  --  110  CO2 16*  --  22  GLUCOSE 537*  --  130*  BUN 25*  --  28*  CREATININE 1.90*  --  1.74*  CALCIUM 8.1*  --  8.2*  GFRNONAA 28*  --  31*  ANIONGAP 11  --  7    Hematology Recent Labs  Lab 09/30/2020 0704 09/19/2020 0722 09/09/2020 1343 09/21/20 0251  WBC 8.7  --  8.0 7.5  RBC 3.15*  --  3.21* 3.00*  HGB 9.6*  10.2* 9.8* 9.0*  HCT 31.5* 30.0* 30.0* 28.2*  MCV 100.0  --  93.5 94.0  MCH 30.5  --  30.5 30.0  MCHC 30.5  --  32.7 31.9  RDW 13.0  --  12.8 12.9  PLT 463*  --  409* 355   BNP Recent Labs  Lab 09/10/2020 0704  BNP 1,649.4*    DDimer No results for input(s): DDIMER in the last 168 hours.   Radiology    DG Chest Portable 1 View  Result Date: 09/11/2020 CLINICAL DATA:  70 year old female with respiratory distress. Recently diagnosed with CHF. Hyperglycemic on presentation. EXAM: PORTABLE CHEST 1 VIEW COMPARISON:  None. FINDINGS: Portable AP semi upright view at 0709 hours. Diffuse and basilar predominant increased pulmonary interstitium with trace fluid in the right minor fissure and veiling opacity at the right lung base. Evidence of mild cardiomegaly. Other mediastinal contours are within normal limits. Visualized tracheal air column is within  normal limits. No pneumothorax or consolidation. Negative visible bowel gas pattern. No acute osseous abnormality identified. IMPRESSION: Acute pulmonary edema with probable small right pleural effusion. Mild cardiomegaly. Electronically Signed   By: Genevie Ann M.D.   On: 09/12/2020 07:29   Cardiac Studies   Echocardiogram 01/26/2020 (Care Everywhere): 1. Mild LV dysfunction. EF is in the 40-45% range. The pattern is  worrisome for CAD.  2. Mild calcific aortic stenosis. Gradients are 20/11 mmHg.  3. Mild LVH _______________  Left Cardiac Catheterization 02/04/2020 (Care Everywhere): Mid LAD 70% stenosis, distal LCx 70% stenosis, mid RCA 80% stenosis, proximal RPDA 50% stenosis, left main unremarkable. _______________  Cardiac MRI 02/06/2020 (Care Everywhere): LVEF 51%, RVEF 67%, LV scar 4%, small subendocardial infarction of basal anterior/mid anterolateral wall (LAD or ramus), small subendocardial infarction of mid inferoseptum (RCA), dysfunctional myocardium in all 3 coronary artery distributions predominantly viable, mild-moderate AS (AVA 1.0cm2, peak gradient 62mmHg)    Patient Profile     70 y.o. female with a history of 3 vessel CAD noted on cardiac catheterization in 01/2020, ischemic cardiomyopathy/chronic systolic CHF, stroke, hypertension, diabetes mellitus who is being seen for evaluation of CHF and elevated troponin after presenting with worsening shortness of breath and lower extremity edema at the request of Dr. Evette Doffing.   Assessment & Plan    Acute on Chronic Systolic CHF Ischemic Cardiomyopathy - Initially diagnosed in 01/2020 in [place]. LHC at the time showed severe 3 vessel CAD. Cardiac MRI showed viable myocardium. She was scheduled to see a CT Surgeon as an outpatient but did not follow through with this due to concerns of coronavirus. - BNP elevated at 1,649.  - Chest x-ray showed acute pulmonary edema with probable small right pleural effusion - Echo in 01/2020 at  [facility] showed LVEF of 40-45% with mid septal/apical hypokinesis, and mild diastolic dysfunction. - Updated Echo pending.  - Currently on IV Lasix 80mg  twice daily. Only 600cc of documented urinary output yesterday. Weight slightly up today. Creatinine improving with diuresis. - Continue current dose of IV Lasix. - Started on Hydralazine 25mg  TID and Imdur 30mg  daily yesterday. Continue. - Has not been on ACEi/ARB/ARNI/MRA due to renal function. - No beta-blockers right now due to decompensated CHF. Has also had bradycardia with beta-blockers in the past. - Continue to monitor daily weights, strict I/O's, and renal function. - Patient has expressed desire to get back to baseline and then go back to [place] to follow-up with providers there for further care.  NSTEMI History of Severe Multivessel CAD - Patient diagnosed  with severe 3 vessel CAD in 01/2020. Cardiac MRI at that time showed viable Myocardium. She was being worked up for CABG in [place] but did not go through with this. - High-sensitivity troponin peaked at 2,156. - EKG shows sinus rhythm with PVCs and non-specific T wave changes. - Patient denies any chest pain throughout all of this. - Continue IV Heparin for 48 hours.  - Contineu Aspirin and Plavix. - No beta-blockers right now due to decompensated CHF. Has also had bradycardia with beta-blockers in the past. - Continue statin and Zetia. - Plan is to get patient tuned up and then have her follow-up with providers in [place]. She states she has an appointment with CT Surgery scheduled for later this week on 09/24/2020.  Hypertension - BP elevated yesterday but better today.  - Continue Amlodipine 10mg  daily. - Continue Hydralazine and Imdur as above.  Hyperlipidemia - Lipid panel this admission: Total Cholesterol 253, Triglycerides 52, HDL 66, LDL 177.  - LDL goal <70 given CAD. - On Pravastatin and Zetia at home. Will stop Pravastatin and start Lipitor 80mg  daily.  Continue Zetia. - Will need repeat lipid panel and LFTs in 2 months.  Acute on CKD Stage III - Creatinine 1.74 today, down from 1.90 yesterday. Baseline around 1.4 to 1.6. - Continue to monitor closely with diuresis.  Prolonged QTc  - QTc 504 ms on EKG today. - Potassium 3.6. - Will check Magnesium.  - Avoid QTc prolonging medications. - Can repeat EKG tomorrow.  Otherwise, per primary team: - Type 2 diabetes mellitus with hyperglycemia - Acidosis - CVA in 2018  For questions or updates, please contact [place] Please consult [email or web address] for contact info under     Signed, Darreld Mclean, PA-C  09/21/2020, 10:09 AM    The patient was seen, examined and discussed with Darreld Mclean, PA-C  and I agree with the above.   70 year old female with known 3 vessel CAD, planned CABG postponed due to Covid, now admitted with decompensated CHF, overall poor condition, poor appetite, weight loss.  The patient feels better but not back to baseline, her LE edema is improved but persistent and she has significant JVD elevation + 6 cm and b/l lung crackles up to mid lung fields. Also 5/6 systolic murmur and missing S2. Echo is pending, ? Severe aortic stenosis.  I would continue aggressive diuresis and fluid optimization prior to her appointment in [place] on 09/24/2020. Crea 1.9 -> 1.74, baseline 1.4-1.6, poor diuresis on lasix 80 mg iv BID, I will add metolazone 2.5 mg po daily. We will follow closely. She has prolonged QT, we will repeat ECG, check Mg, replace K with 40 mEq BID.  Ena Dawley, MD 09/21/2020

## 2020-09-22 ENCOUNTER — Inpatient Hospital Stay: Payer: Self-pay

## 2020-09-22 DIAGNOSIS — I10 Essential (primary) hypertension: Secondary | ICD-10-CM | POA: Diagnosis not present

## 2020-09-22 DIAGNOSIS — I5023 Acute on chronic systolic (congestive) heart failure: Secondary | ICD-10-CM | POA: Diagnosis not present

## 2020-09-22 DIAGNOSIS — E119 Type 2 diabetes mellitus without complications: Secondary | ICD-10-CM | POA: Diagnosis not present

## 2020-09-22 DIAGNOSIS — I214 Non-ST elevation (NSTEMI) myocardial infarction: Secondary | ICD-10-CM | POA: Diagnosis not present

## 2020-09-22 DIAGNOSIS — N1832 Chronic kidney disease, stage 3b: Secondary | ICD-10-CM

## 2020-09-22 LAB — CBC
HCT: 26.6 % — ABNORMAL LOW (ref 36.0–46.0)
Hemoglobin: 8.7 g/dL — ABNORMAL LOW (ref 12.0–15.0)
MCH: 30.5 pg (ref 26.0–34.0)
MCHC: 32.7 g/dL (ref 30.0–36.0)
MCV: 93.3 fL (ref 80.0–100.0)
Platelets: 379 10*3/uL (ref 150–400)
RBC: 2.85 MIL/uL — ABNORMAL LOW (ref 3.87–5.11)
RDW: 13.2 % (ref 11.5–15.5)
WBC: 8.4 10*3/uL (ref 4.0–10.5)
nRBC: 0 % (ref 0.0–0.2)

## 2020-09-22 LAB — GLUCOSE, CAPILLARY
Glucose-Capillary: 253 mg/dL — ABNORMAL HIGH (ref 70–99)
Glucose-Capillary: 217 mg/dL — ABNORMAL HIGH (ref 70–99)
Glucose-Capillary: 148 mg/dL — ABNORMAL HIGH (ref 70–99)
Glucose-Capillary: 205 mg/dL — ABNORMAL HIGH (ref 70–99)

## 2020-09-22 LAB — BASIC METABOLIC PANEL
Anion gap: 8 (ref 5–15)
BUN: 32 mg/dL — ABNORMAL HIGH (ref 8–23)
CO2: 24 mmol/L (ref 22–32)
Calcium: 8.5 mg/dL — ABNORMAL LOW (ref 8.9–10.3)
Chloride: 106 mmol/L (ref 98–111)
Creatinine, Ser: 1.86 mg/dL — ABNORMAL HIGH (ref 0.44–1.00)
GFR, Estimated: 29 mL/min — ABNORMAL LOW (ref >60)
Glucose, Bld: 217 mg/dL — ABNORMAL HIGH (ref 70–99)
Potassium: 4.2 mmol/L (ref 3.5–5.1)
Sodium: 138 mmol/L (ref 135–145)

## 2020-09-22 LAB — HEPARIN LEVEL (UNFRACTIONATED): Heparin Unfractionated: 0.12 IU/mL — ABNORMAL LOW (ref 0.30–0.70)

## 2020-09-22 MED ORDER — AMLODIPINE BESYLATE 5 MG PO TABS: 5.0000 mg | ORAL_TABLET | Freq: Every day | ORAL | Status: DC

## 2020-09-22 NOTE — Progress Notes (Signed)
   Subjective:   No acute overnight events.  Feels good today. She would like to stay here in [place] and obtain CABG evaluation here. States that she will live with her son and daughter-in-law after the procedure.  Objective:  Vital signs in last 24 hours: Vitals:   09/21/20 0907 09/21/20 1505 09/21/20 2100 09/22/20 0559  BP: 132/68 (!) 115/56 126/73 (!) 106/53  Pulse: 68 96 82 95  Resp: 18 18 19 18   Temp: 98.3 F (36.8 C) 98.6 F (37 C) 98.5 F (36.9 C) 98.4 F (36.9 C)  TempSrc: Oral Oral Oral Oral  SpO2: 100% 99% 98% 99%  Weight:    65.3 kg  Height:       General: elderly female, sitting up in bed, NAD. CV: normal rate and regular rhythm. Parasternal heave present. JVP improved from yesterday. Pulm: bibasilar crackles on exam, improved from yesterday. MSK: 1+ pitting edema in bilateral lower extremities up to ankles, trace edema up to mid-shins bilaterally.  Skin: warm and dry Neuro: mentating appropriately, no focal deficits noted.  Assessment/Plan:  Principal Problem:   Acute on chronic HFrEF (heart failure with reduced ejection fraction) (HCC) Active Problems:   NSTEMI (non-ST elevated myocardial infarction) ([place])   Diabetes mellitus ([place])   Essential hypertension   Hyperlipidemia   CKD (chronic kidney disease), stage III ([place])  70 year old person with ischemic heart disease, T2DM, HTN, HLD, and CKD stage IIIb admitted for management of acute on chronic HFrEF, now awaiting evaluation for possible inpatient CABG.  Acute on chronic systolic heart failure (LVEF 25-30%) Cardiology following, appreciate recommendations. Currently on IV lasix 80mg  BID and PO metolazone 2.5mg  daily. UOP noted as 1L, but patient is likely having a decent amount of unmeasured UOP as her volume status looks a lot improved today compared to yesterday. Will continue with current diuretic regimen and reassess volume status tomorrow. Patient wants CABG and AVR done here at [facility] now.  Cardiology to consult CT surgery. -continue hydralazine and imdur -decreased norvasc to improve renal perfusion as per cardiology  Type 2 NSTEMI Hx of Severe multivessel CAD HTN HLD Likely etiology from volume overload and hypoxia. HS troponin peaked at 2,156. Patient with no complaints of chest pain. Continuing ASA, plavix, lipitor, and zetia. Last day of IV heparin today. Avoid beta-blockers due to decompensated CHF.  CKD stage IIIb Baseline Cr ~1.5 with GFR ~41. Creatinine with slight worsening (1.74 > 1.86), but relatively stable. Continue trending BMP.  Prolonged QTc Avoid QTc prolonging medications.  Prior to Admission Living Arrangement: Home (in [place], [place]) Anticipated Discharge Location: TBD Barriers to Discharge: continued medical management Dispo: Anticipated discharge in approximately 2-3 day(s).   Virl Axe, MD 09/22/2020, 9:26 AM Pager: [phone] After 5pm on weekdays and 1pm on weekends: On Call pager [phone]

## 2020-09-22 NOTE — Progress Notes (Signed)
Progress Note  Patient Name: Sara [name] Date of Encounter: 09/22/2020  [facility] Cardiologist: Follows in [place]  Subjective   She feels better today, improved LE edema.   Inpatient Medications    Scheduled Meds: . amLODipine  10 mg Oral Daily  . aspirin EC  81 mg Oral Daily  . atorvastatin  80 mg Oral Daily  . clopidogrel  75 mg Oral Daily  . ezetimibe  10 mg Oral Daily  . feeding supplement  237 mL Oral BID BM  . furosemide  80 mg Intravenous BID  . hydrALAZINE  25 mg Oral Q8H  . insulin aspart  0-15 Units Subcutaneous TID WC  . insulin aspart  0-5 Units Subcutaneous QHS  . insulin aspart  3 Units Subcutaneous TID WC  . isosorbide mononitrate  30 mg Oral Daily  . metolazone  2.5 mg Oral Daily  . potassium chloride  40 mEq Oral BID  . sodium chloride flush  3 mL Intravenous Q12H   Continuous Infusions: . sodium chloride    . heparin 850 Units/hr (09/22/20 0600)   PRN Meds: sodium chloride, acetaminophen, ondansetron (ZOFRAN) IV, sodium chloride flush   Vital Signs    Vitals:   09/21/20 1505 09/21/20 2100 09/22/20 0559 09/22/20 0945  BP: (!) 115/56 126/73 (!) 106/53 134/60  Pulse: 96 82 95   Resp: 18 19 18    Temp: 98.6 F (37 C) 98.5 F (36.9 C) 98.4 F (36.9 C)   TempSrc: Oral Oral Oral   SpO2: 99% 98% 99%   Weight:   65.3 kg   Height:        Intake/Output Summary (Last 24 hours) at 09/22/2020 1027 Last data filed at 09/22/2020 3875 Gross per 24 hour  Intake 832.31 ml  Output 1000 ml  Net -167.69 ml   Last 3 Weights 09/22/2020 09/21/2020 10/01/2020  Weight (lbs) 143 lb 15.4 oz 143 lb 3.2 oz 141 lb 1.5 oz  Weight (kg) 65.3 kg 64.955 kg 64 kg     Telemetry    Sinus rhythm with rates in the 80-90s. Frequent PVCs (often trigeminy pattern) as well as short runs of NSVT (longest run being 6 beats). - Personally Reviewed  ECG    Normal sinus rhythm, rate 93 bpm, with PVCs and non-specific ST/T changes. Normal axis. Prolonged QTc of 504 ms. -  Personally Reviewed  Physical Exam   GEN: No acute distress.   Neck: No JVD. Cardiac: Borderline tachycardic. III/VI systolic murmur. No rubs or gallops. Respiratory: No increased work of breathing. Mild crackles in bilateral bases. GI: Soft, non-distended, and non-tender.  MS: Trace lower extremity edema. No deformity. Skin: Warm and dry. Neuro:  No focal deficits. Psych: Normal affect. Responds appropriately.   Labs    High Sensitivity Troponin:   Recent Labs  Lab 09/12/2020 1343 10/05/2020 1608 09/30/2020 2210 09/21/20 0636 09/21/20 0832  TROPONINIHS 1,109* 1,413* 2,156* 1,643* 1,826*     Chemistry Recent Labs  Lab 09/19/2020 0704 09/17/2020 0722 09/21/20 0251 09/22/20 0237  NA 135 137 139 138  K 3.7 3.6 3.6 4.2  CL 108  --  110 106  CO2 16*  --  22 24  GLUCOSE 537*  --  130* 217*  BUN 25*  --  28* 32*  CREATININE 1.90*  --  1.74* 1.86*  CALCIUM 8.1*  --  8.2* 8.5*  GFRNONAA 28*  --  31* 29*  ANIONGAP 11  --  7 8    Hematology Recent Labs  Lab  09/25/2020 1343 09/21/20 0251 09/22/20 0237  WBC 8.0 7.5 8.4  RBC 3.21* 3.00* 2.85*  HGB 9.8* 9.0* 8.7*  HCT 30.0* 28.2* 26.6*  MCV 93.5 94.0 93.3  MCH 30.5 30.0 30.5  MCHC 32.7 31.9 32.7  RDW 12.8 12.9 13.2  PLT 409* 355 379   BNP Recent Labs  Lab 09/23/2020 0704  BNP 1,649.4*    DDimer No results for input(s): DDIMER in the last 168 hours.   Radiology    ECHOCARDIOGRAM COMPLETE: 09/21/2020 IMPRESSIONS  1. Left ventricular ejection fraction, by estimation, is 25 to 30%. The left ventricle has severely decreased function. The left ventricle demonstrates regional wall motion abnormalities (see scoring diagram/findings for description). The left ventricular internal cavity size was mildly dilated. There is mild left ventricular hypertrophy. Left ventricular diastolic parameters are consistent with Grade II diastolic dysfunction (pseudonormalization). There is severe akinesis of the left ventricular, mid-apical  anteroseptal wall and apical segment. There is severe akinesis of the left ventricular, entire inferior wall.  2. Right ventricular systolic function is normal. The right ventricular size is normal. There is mildly elevated pulmonary artery systolic pressure.  3. Left atrial size was mildly dilated.  4. The mitral valve is grossly normal. Trivial mitral valve regurgitation.  5. The aortic valve is calcified. There is moderate calcification of the aortic valve. There is moderate thickening of the aortic valve. Aortic valve regurgitation is trivial. Mild aortic valve stenosis.   Cardiac Studies   Echocardiogram 01/26/2020 (Care Everywhere): 1. Mild LV dysfunction. EF is in the 40-45% range. The pattern is  worrisome for CAD.  2. Mild calcific aortic stenosis. Gradients are 20/11 mmHg.  3. Mild LVH _______________  Left Cardiac Catheterization 02/04/2020 (Care Everywhere): Mid LAD 70% stenosis, distal LCx 70% stenosis, mid RCA 80% stenosis, proximal RPDA 50% stenosis, left main unremarkable. _______________  Cardiac MRI 02/06/2020 (Care Everywhere): LVEF 51%, RVEF 67%, LV scar 4%, small subendocardial infarction of basal anterior/mid anterolateral wall (LAD or ramus), small subendocardial infarction of mid inferoseptum (RCA), dysfunctional myocardium in all 3 coronary artery distributions predominantly viable, mild-moderate AS (AVA 1.0cm2, peak gradient 15mmHg)    Patient Profile     70 y.o. female with a history of 3 vessel CAD noted on cardiac catheterization in 01/2020, ischemic cardiomyopathy/chronic systolic CHF, stroke, hypertension, diabetes mellitus who is being seen for evaluation of CHF and elevated troponin after presenting with worsening shortness of breath and lower extremity edema at the request of Dr. Evette Doffing.   Assessment & Plan    Acute on Chronic Systolic CHF Ischemic Cardiomyopathy - Initially diagnosed in 01/2020 in [place]. LHC at the time showed severe 3 vessel CAD.  Cardiac MRI showed viable myocardium. She was scheduled to see a CT Surgeon as an outpatient but did not follow through with this due to concerns of coronavirus. - BNP elevated at 1,649.  - Chest x-ray showed acute pulmonary edema with probable small right pleural effusion - Echo in 01/2020 at [facility] showed LVEF of 40-45% with mid septal/apical hypokinesis, and mild diastolic dysfunction. - echo yesterday LVEF 25-30% with mildly dilated left ventricle, severely calcified aortic valve with AVA 0.87 cm2 and DVI 0.31 consistent with moderate to severe aortic stenosis  - Currently on IV Lasix 80mg  twice daily. Metolazone 2.5 mg po daily added yesterday, almost anuric, however the patient feels better, she ultimately needs CABG and AVR, she now wants to have it done at [facility], we will consult CT surgery, depending on their recommendation, we will obtain central  line, obtain Co-ox, start inotropes  - Started on Hydralazine 25mg  TID and Imdur 30mg  daily yesterday. I will decrease amlodipine to improve kidney perfusion - Has not been on ACEi/ARB/ARNI/MRA due to renal function.   NSTEMI History of Severe Multivessel CAD - Patient diagnosed with severe 3 vessel CAD in 01/2020. Cardiac MRI at that time showed viable Myocardium. She was being worked up for CABG in [place] but did not go through with this. - High-sensitivity troponin peaked at 2,156. - Contineu Aspirin and Plavix. - Continue statin and Zetia.  Hypertension - controlled.  Hyperlipidemia - Lipid panel this admission: Total Cholesterol 253, Triglycerides 52, HDL 66, LDL 177.  - LDL goal <70 given CAD. - On Pravastatin and Zetia at home. Will stop Pravastatin and start Lipitor 80mg  daily. Continue Zetia. - Will need repeat lipid panel and LFTs in 2 months.  Acute on CKD Stage III - Creatinine 1.8   Prolonged QTc  - QTc 532 ms on EKG today. - Potassium 4.2 - Magnesium 1.9.   Otherwise, per primary team: - Type 2 diabetes mellitus  with hyperglycemia - Acidosis - CVA in 2018  For questions or updates, please contact [place] Please consult [email or web address] for contact info under     Signed, Ena Dawley, MD  09/22/2020, 10:27 AM

## 2020-09-22 NOTE — Progress Notes (Signed)
[place] for Heparin Indication: chest pain/ACS  Allergies  Allergen Reactions  . Naproxen Nausea And Vomiting    Patient Measurements: Height: 5\' 2"  (157.5 cm) Weight: 65 kg (143 lb 3.2 oz) IBW/kg (Calculated) : 50.1 Heparin Dosing Weight: 63 kg  Vital Signs: Temp: 98.5 F (36.9 C) (03/14 2100) Temp Source: Oral (03/14 2100) BP: 126/73 (03/14 2100) Pulse Rate: 82 (03/14 2100)  Labs: Recent Labs    09/09/2020 0704 10/02/2020 0722 10/01/2020 1343 10/05/2020 1608 10/08/2020 2210 09/21/20 0251 09/21/20 0636 09/21/20 0832 09/22/20 0237  HGB 9.6*   < > 9.8*  --   --  9.0*  --   --  8.7*  HCT 31.5*   < > 30.0*  --   --  28.2*  --   --  26.6*  PLT 463*  --  409*  --   --  355  --   --  379  HEPARINUNFRC  --   --   --   --  0.40 0.31  --   --  0.12*  CREATININE 1.90*  --   --   --   --  1.74*  --   --  1.86*  TROPONINIHS 68*   < > 1,109*   < > 2,156*  --  1,643* 1,826*  --    < > = values in this interval not displayed.    Estimated Creatinine Clearance: 25.3 mL/min (A) (by C-G formula based on SCr of 1.86 mg/dL (H)).  Assessment: 70 y.o. female with SOB and elevated troponin for heparin  Goal of Therapy:  Heparin level 0.3-0.7 units/ml Monitor platelets by anticoagulation protocol: Yes   Plan:  Increase Heparin 850 units/hr  Cylus Douville, Bronson Curb PharmD. BCPS  09/22/2020,4:51 AM

## 2020-09-22 NOTE — Progress Notes (Signed)
Inpatient Diabetes Program Recommendations  AACE/ADA: New Consensus Statement on Inpatient Glycemic Control (2015)  Target Ranges:  Prepandial:   less than 140 mg/dL      Peak postprandial:   less than 180 mg/dL (1-2 hours)      Critically ill patients:  140 - 180 mg/dL   Lab Results  Component Value Date   GLUCAP 217 (H) 09/22/2020   HGBA1C 9.3 (H) 09/16/2020    Review of Glycemic Control Results for Sara [name], Sara [name] (MRN [id number]) as of 09/22/2020 16:08  Ref. Range 09/22/2020 08:13 09/22/2020 12:13  Glucose-Capillary Latest Ref Range: 70 - 99 mg/dL 253 (H) 217 (H)    Inpatient Diabetes Program Recommendations:     If remains inpatient, please consider:  Lantus 7 units daily  Will continue to follow while inpatient.  Thank you, Reche Dixon, RN, BSN Diabetes Coordinator Inpatient Diabetes Program [phone] (team pager from 8a-5p)

## 2020-09-22 NOTE — Progress Notes (Signed)
  Mobility Specialist Criteria Algorithm Info.  SATURATION QUALIFICATIONS: (This note is used to comply with regulatory documentation for home oxygen)  Patient Saturations on Room Air at Rest = 100%  Patient Saturations on Room Air while Ambulating = 98%  Patient Saturations on 0 Liters of oxygen while Ambulating = n/a%  Please briefly explain why patient needs home oxygen:  Mobility Team:  [facility] elevated:Self regulated Activity: Ambulated in hall; Dangled on edge of bed Range of motion: Active; All extremities Level of assistance: Independent after set-up Assistive device: Other (Comment) (IV Pole) Minutes sitting in chair:  Minutes stood: 5 minutes Minutes ambulated: 5 minutes Distance ambulated (ft): 360 ft Mobility response: Tolerated well Bed Position: Semi-fowlers  Patient received lying supine in bed, agreed to participate in mobility. She is independent with ambulation, transfers, and ADL's. Patient stood and ambulated in hallway 360 feet independently with steady gait. Did not require the need for supplemental oxygen, saturating 98-100% while ambulating on room air. Patient tolerated ambulation well without incident or complaints and is now dangling EOB with all needs met.   09/22/2020 2:38 PM

## 2020-09-23 ENCOUNTER — Inpatient Hospital Stay: Payer: Self-pay

## 2020-09-23 DIAGNOSIS — N1832 Chronic kidney disease, stage 3b: Secondary | ICD-10-CM | POA: Diagnosis not present

## 2020-09-23 DIAGNOSIS — I10 Essential (primary) hypertension: Secondary | ICD-10-CM

## 2020-09-23 DIAGNOSIS — N171 Acute kidney failure with acute cortical necrosis: Secondary | ICD-10-CM | POA: Diagnosis not present

## 2020-09-23 DIAGNOSIS — I350 Nonrheumatic aortic (valve) stenosis: Secondary | ICD-10-CM

## 2020-09-23 DIAGNOSIS — N184 Chronic kidney disease, stage 4 (severe): Secondary | ICD-10-CM

## 2020-09-23 DIAGNOSIS — I5023 Acute on chronic systolic (congestive) heart failure: Secondary | ICD-10-CM | POA: Diagnosis not present

## 2020-09-23 DIAGNOSIS — I214 Non-ST elevation (NSTEMI) myocardial infarction: Secondary | ICD-10-CM | POA: Diagnosis not present

## 2020-09-23 LAB — CBC
HCT: 27.7 % — ABNORMAL LOW (ref 36.0–46.0)
Hemoglobin: 8.5 g/dL — ABNORMAL LOW (ref 12.0–15.0)
MCH: 29.5 pg (ref 26.0–34.0)
MCHC: 30.7 g/dL (ref 30.0–36.0)
MCV: 96.2 fL (ref 80.0–100.0)
Platelets: 394 10*3/uL (ref 150–400)
RBC: 2.88 MIL/uL — ABNORMAL LOW (ref 3.87–5.11)
RDW: 13.3 % (ref 11.5–15.5)
WBC: 8.3 10*3/uL (ref 4.0–10.5)
nRBC: 0 % (ref 0.0–0.2)

## 2020-09-23 LAB — BASIC METABOLIC PANEL
Anion gap: 6 (ref 5–15)
BUN: 36 mg/dL — ABNORMAL HIGH (ref 8–23)
CO2: 25 mmol/L (ref 22–32)
Calcium: 8.5 mg/dL — ABNORMAL LOW (ref 8.9–10.3)
Chloride: 107 mmol/L (ref 98–111)
Creatinine, Ser: 2.04 mg/dL — ABNORMAL HIGH (ref 0.44–1.00)
GFR, Estimated: 26 mL/min — ABNORMAL LOW (ref >60)
Glucose, Bld: 228 mg/dL — ABNORMAL HIGH (ref 70–99)
Potassium: 5.5 mmol/L — ABNORMAL HIGH (ref 3.5–5.1)
Sodium: 138 mmol/L (ref 135–145)

## 2020-09-23 LAB — COOXEMETRY PANEL
Carboxyhemoglobin: 1.2 % (ref 0.5–1.5)
Methemoglobin: 1.2 % (ref 0.0–1.5)
O2 Saturation: 50.9 %
Total hemoglobin: 8.3 g/dL — ABNORMAL LOW (ref 12.0–16.0)

## 2020-09-23 LAB — GLUCOSE, CAPILLARY
Glucose-Capillary: 231 mg/dL — ABNORMAL HIGH (ref 70–99)
Glucose-Capillary: 168 mg/dL — ABNORMAL HIGH (ref 70–99)
Glucose-Capillary: 184 mg/dL — ABNORMAL HIGH (ref 70–99)
Glucose-Capillary: 114 mg/dL — ABNORMAL HIGH (ref 70–99)

## 2020-09-23 LAB — MAGNESIUM: Magnesium: 1.9 mg/dL (ref 1.7–2.4)

## 2020-09-23 MED ORDER — SODIUM ZIRCONIUM CYCLOSILICATE 10 G PO PACK
10.0000 g | PACK | Freq: Once | ORAL | Status: AC
  Administered 2020-09-23: 10 g via ORAL
  Filled 2020-09-23: qty 1

## 2020-09-23 MED ORDER — CHLORHEXIDINE GLUCONATE CLOTH 2 % EX PADS
6.0000 | MEDICATED_PAD | Freq: Every day | CUTANEOUS | Status: DC
  Administered 2020-09-23 – 2020-10-02 (×10): 6 via TOPICAL

## 2020-09-23 MED ORDER — INSULIN GLARGINE 100 UNIT/ML ~~LOC~~ SOLN
7.0000 [IU] | Freq: Every day | SUBCUTANEOUS | Status: DC
  Administered 2020-09-23 – 2020-09-24 (×2): 7 [IU] via SUBCUTANEOUS
  Filled 2020-09-23 (×2): qty 0.07

## 2020-09-23 MED ORDER — SODIUM CHLORIDE 0.9% FLUSH
10.0000 mL | INTRAVENOUS | Status: DC | PRN
Start: 2020-09-23 — End: 2020-10-04
  Administered 2020-09-29: 10 mL

## 2020-09-23 NOTE — Progress Notes (Signed)
Peripherally Inserted Central Catheter Placement  The IV Nurse has discussed with the patient and/or persons authorized to consent for the patient, the purpose of this procedure and the potential benefits and risks involved with this procedure.  The benefits include less needle sticks, lab draws from the catheter, and the patient may be discharged home with the catheter. Risks include, but not limited to, infection, bleeding, blood clot (thrombus formation), and puncture of an artery; nerve damage and irregular heartbeat and possibility to perform a PICC exchange if needed/ordered by physician.  Alternatives to this procedure were also discussed.  Bard Power PICC patient education guide, fact sheet on infection prevention and patient information card has been provided to patient /or left at bedside.    PICC Placement Documentation  PICC Triple Lumen 40/37/54 PICC Right Basilic 38 cm 0 cm (Active)  Indication for Insertion or Continuance of Line Prolonged intravenous therapies 09/23/20 1605  Exposed Catheter (cm) 0 cm 09/23/20 1605  Site Assessment Clean;Dry;Intact 09/23/20 1605  Lumen #1 Status Flushed;Blood return noted;Saline locked 09/23/20 1605  Lumen #2 Status Flushed;Blood return noted;Saline locked 09/23/20 1605  Lumen #3 Status Flushed;Blood return noted;Saline locked 09/23/20 1605  Dressing Type Transparent 09/23/20 1605  Dressing Status Clean;Intact;Dry 09/23/20 1605  Antimicrobial disc in place? Yes 09/23/20 1605  Dressing Change Due 09/30/20 09/23/20 1605       Scotty Court 09/23/2020, 4:08 PM

## 2020-09-23 NOTE — Progress Notes (Addendum)
Progress Note  Patient Name: Sara [name] Date of Encounter: 09/23/2020  [facility] Cardiologist: No primary care provider on file. followed by Marikay Alar pain when cleaning up this AM last 1-2 seconds.  But she did have DOE and had to stop and rest.   Inpatient Medications    Scheduled Meds: . aspirin EC  81 mg Oral Daily  . atorvastatin  80 mg Oral Daily  . clopidogrel  75 mg Oral Daily  . ezetimibe  10 mg Oral Daily  . feeding supplement  237 mL Oral BID BM  . furosemide  80 mg Intravenous BID  . hydrALAZINE  25 mg Oral Q8H  . insulin aspart  0-15 Units Subcutaneous TID WC  . insulin aspart  0-5 Units Subcutaneous QHS  . insulin aspart  3 Units Subcutaneous TID WC  . insulin glargine  7 Units Subcutaneous Daily  . isosorbide mononitrate  30 mg Oral Daily  . metolazone  2.5 mg Oral Daily  . sodium chloride flush  3 mL Intravenous Q12H  . sodium zirconium cyclosilicate  10 g Oral Once   Continuous Infusions: . sodium chloride     PRN Meds: sodium chloride, acetaminophen, ondansetron (ZOFRAN) IV, sodium chloride flush   Vital Signs    Vitals:   09/22/20 1700 09/22/20 1713 09/22/20 2134 09/23/20 0406  BP: 129/64 129/64 106/65 114/62  Pulse:  82 85 89  Resp:  18 19 20   Temp: 98.9 F (37.2 C) 98.9 F (37.2 C) 98.3 F (36.8 C) 98 F (36.7 C)  TempSrc: Oral Oral Axillary Axillary  SpO2:   96% 100%  Weight:    62.1 kg  Height:        Intake/Output Summary (Last 24 hours) at 09/23/2020 0821 Last data filed at 09/22/2020 2000 Gross per 24 hour  Intake 253.59 ml  Output 876 ml  Net -622.41 ml   Last 3 Weights 09/23/2020 09/22/2020 09/21/2020  Weight (lbs) 137 lb 143 lb 15.4 oz 143 lb 3.2 oz  Weight (kg) 62.143 kg 65.3 kg 64.955 kg      Telemetry    SR with PVCs  - Personally Reviewed  ECG    No new - Personally Reviewed  Physical Exam   GEN: No acute distress.   Neck: No JVD Cardiac: RRR, with premature beats, 1-0/9 systolic  murmur, rubs, or gallops.  Respiratory: Clear to auscultation bilaterally. GI: Soft, nontender, non-distended  MS: No edema; No deformity. Neuro:  Nonfocal  Psych: Normal affect   Labs    High Sensitivity Troponin:   Recent Labs  Lab 10/06/2020 1343 09/08/2020 1608 09/27/2020 2210 09/21/20 0636 09/21/20 0832  TROPONINIHS 1,109* 1,413* 2,156* 1,643* 1,826*      Chemistry Recent Labs  Lab 09/21/20 0251 09/22/20 0237 09/23/20 0313  NA 139 138 138  K 3.6 4.2 5.5*  CL 110 106 107  CO2 22 24 25   GLUCOSE 130* 217* 228*  BUN 28* 32* 36*  CREATININE 1.74* 1.86* 2.04*  CALCIUM 8.2* 8.5* 8.5*  GFRNONAA 31* 29* 26*  ANIONGAP 7 8 6      Hematology Recent Labs  Lab 09/21/20 0251 09/22/20 0237 09/23/20 0313  WBC 7.5 8.4 8.3  RBC 3.00* 2.85* 2.88*  HGB 9.0* 8.7* 8.5*  HCT 28.2* 26.6* 27.7*  MCV 94.0 93.3 96.2  MCH 30.0 30.5 29.5  MCHC 31.9 32.7 30.7  RDW 12.9 13.2 13.3  PLT 355 379 394    BNP Recent Labs  Lab 09/13/2020 0704  BNP 1,649.4*     DDimer No results for input(s): DDIMER in the last 168 hours.   Radiology    ECHOCARDIOGRAM COMPLETE  Result Date: 09/21/2020    ECHOCARDIOGRAM REPORT   Patient Name:   Sara [name] Date of Exam: 09/21/2020 Medical Rec #:  [id number]       Height:       62.0 in Accession #:    [id number]      Weight:       143.2 lb Date of Birth:  1950-08-14        BSA:          1.659 m Patient Age:    70 years        BP:           132/68 mmHg Patient Gender: F               HR:           96 bpm. Exam Location:  Inpatient Procedure: 2D Echo, 3D Echo, Cardiac Doppler, Color Doppler and Intracardiac            Opacification Agent Indications:    Congestive Heart Failure I50.9  History:        Patient has prior history of Echocardiogram examinations, most                 recent 01/31/2020. CAD, Stroke; Risk Factors:Hypertension and                 Diabetes. Acute on chronic kidney disease. Elevated troponins.  Sonographer:    Darlina Sicilian RDCS Referring  Phys: [place]  1. Left ventricular ejection fraction, by estimation, is 25 to 30%. The left ventricle has severely decreased function. The left ventricle demonstrates regional wall motion abnormalities (see scoring diagram/findings for description). The left ventricular internal cavity size was mildly dilated. There is mild left ventricular hypertrophy. Left ventricular diastolic parameters are consistent with Grade II diastolic dysfunction (pseudonormalization). There is severe akinesis of the left ventricular, mid-apical anteroseptal wall and apical segment. There is severe akinesis of the left ventricular, entire inferior wall.  2. Right ventricular systolic function is normal. The right ventricular size is normal. There is mildly elevated pulmonary artery systolic pressure.  3. Left atrial size was mildly dilated.  4. The mitral valve is grossly normal. Trivial mitral valve regurgitation.  5. The aortic valve is calcified. There is moderate calcification of the aortic valve. There is moderate thickening of the aortic valve. Aortic valve regurgitation is trivial. Mild aortic valve stenosis. FINDINGS  Left Ventricle: Left ventricular ejection fraction, by estimation, is 25 to 30%. The left ventricle has severely decreased function. The left ventricle demonstrates regional wall motion abnormalities. Severe akinesis of the left ventricular, mid-apical anteroseptal wall and apical segment. Severe akinesis of the left ventricular, entire inferior wall. Definity contrast agent was given IV to delineate the left ventricular endocardial borders. The left ventricular internal cavity size was mildly dilated.  There is mild left ventricular hypertrophy. Left ventricular diastolic parameters are consistent with Grade II diastolic dysfunction (pseudonormalization). Right Ventricle: The right ventricular size is normal. No increase in right ventricular wall thickness. Right ventricular systolic function is  normal. There is mildly elevated pulmonary artery systolic pressure. The tricuspid regurgitant velocity is 3.08  m/s, and with an assumed right atrial pressure of 3 mmHg, the estimated right ventricular systolic pressure is 98.9 mmHg. Left Atrium: Left atrial size was mildly dilated. Right Atrium: Right atrial  size was normal in size. Pericardium: There is no evidence of pericardial effusion. Mitral Valve: The mitral valve is grossly normal. Trivial mitral valve regurgitation. Tricuspid Valve: The tricuspid valve is grossly normal. Tricuspid valve regurgitation is mild. Aortic Valve: The aortic valve is calcified. There is moderate calcification of the aortic valve. There is moderate thickening of the aortic valve. There is moderate aortic valve annular calcification. Aortic valve regurgitation is trivial. Mild aortic stenosis is present. Aortic valve mean gradient measures 11.8 mmHg. Aortic valve peak gradient measures 20.6 mmHg. Aortic valve area, by VTI measures 0.89 cm. Pulmonic Valve: The pulmonic valve was grossly normal. Pulmonic valve regurgitation is not visualized. Aorta: The aortic root and ascending aorta are structurally normal, with no evidence of dilitation. IAS/Shunts: The atrial septum is grossly normal.  LEFT VENTRICLE PLAX 2D LVIDd:         4.40 cm  Diastology LVIDs:         3.50 cm  LV e' medial:    4.68 cm/s LV PW:         1.20 cm  LV E/e' medial:  23.3 LV IVS:        1.20 cm  LV e' lateral:   4.79 cm/s LVOT diam:     1.90 cm  LV E/e' lateral: 22.8 LV SV:         41 LV SV Index:   25 LVOT Area:     2.84 cm                          3D Volume EF:                         3D EF:        44 %                         LV EDV:       162 ml                         LV ESV:       90 ml                         LV SV:        71 ml RIGHT VENTRICLE RV S prime:     15.30 cm/s TAPSE (M-mode): 2.6 cm LEFT ATRIUM           Index       RIGHT ATRIUM           Index LA diam:      3.50 cm 2.11 cm/m  RA Area:      12.50 cm LA Vol (A2C): 47.3 ml 28.52 ml/m RA Volume:   30.70 ml  18.51 ml/m LA Vol (A4C): 57.4 ml 34.61 ml/m  AORTIC VALVE AV Area (Vmax):    0.87 cm AV Area (Vmean):   0.86 cm AV Area (VTI):     0.89 cm AV Vmax:           227.00 cm/s AV Vmean:          158.500 cm/s AV VTI:            0.468 m AV Peak Grad:      20.6 mmHg AV Mean Grad:      11.8 mmHg LVOT Vmax:  69.63 cm/s LVOT Vmean:        48.100 cm/s LVOT VTI:          0.146 m LVOT/AV VTI ratio: 0.31  AORTA Ao Root diam: 2.40 cm Ao Asc diam:  2.90 cm MITRAL VALVE                TRICUSPID VALVE MV Area (PHT): 6.48 cm     TR Peak grad:   37.9 mmHg MV Decel Time: 117 msec     TR Vmax:        308.00 cm/s MV E velocity: 109.00 cm/s MV A velocity: 118.00 cm/s  SHUNTS MV E/A ratio:  0.92         Systemic VTI:  0.15 m                             Systemic Diam: 1.90 cm Mertie Moores MD Electronically signed by Mertie Moores MD Signature Date/Time: 09/21/2020/11:41:48 AM    Final     Cardiac Studies   Echocardiogram 01/26/2020 (Care Everywhere): 1. Mild LV dysfunction. EF is in the 40-45% range. The pattern is  worrisome for CAD.  2. Mild calcific aortic stenosis. Gradients are 20/11 mmHg.  3. Mild LVH _______________  Left Cardiac Catheterization 02/04/2020 (Care Everywhere): Mid LAD 70% stenosis, distal LCx 70% stenosis, mid RCA 80% stenosis, proximal RPDA 50% stenosis, left main unremarkable. _______________  Cardiac MRI 02/06/2020 (Care Everywhere): LVEF 51%, RVEF 67%, LV scar 4%, small subendocardial infarction of basal anterior/mid anterolateral wall (LAD or ramus), small subendocardial infarction of mid inferoseptum (RCA), dysfunctional myocardium in all 3 coronary artery distributions predominantly viable, mild-moderate AS (AVA 1.0cm2, peak gradient 10mmHg) Films in Epic now  Patient Profile     70 y.o. female with a history of 3 vessel CAD noted on cardiac catheterization in 01/2020, ischemic cardiomyopathy/chronic systolic  CHF, stroke, hypertension, diabetes mellitus now admitted for CHF and elevated troponin.   Assessment & Plan    Acute on Chronic Systolic CHF Ischemic Cardiomyopathy/Moderate to severe AS - Initially diagnosed in 01/2020 in [place]. LHC at the time showed severe 3 vessel CAD. Cardiac MRI showed viable myocardium. She was scheduled to see a CT Surgeon as an outpatient but did not follow through with this due to concerns of coronavirus. - BNP elevated at 1,649.  - Chest x-ray showed acute pulmonary edema with probable small right pleural effusion - Echo in 01/2020 at [facility] showed LVEF of 40-45% with mid septal/apical hypokinesis, and mild diastolic dysfunction. - echo yesterday LVEF 25-30% with mildly dilated left ventricle, severely calcified aortic valve with AVA 0.87 cm2 and DVI 0.31 consistent with moderate to severe aortic stenosis  - Currently on IV Lasix 80mg  twice daily. Metolazone 2.5 mg po daily NOW stopped due to increase of Cr  however the patient feels better, she ultimately needs CABG and AVR, she now wants to have it done at [facility], we will consult CT surgery, depending on their recommendation, we will obtain central line, obtain Co-ox, start inotropes- plan for Rt and Lt cardiac cath on Friday   - Started on Hydralazine 25mg  TID and Imdur 30mg  daily.  I will decrease amlodipine to improve kidney perfusion - Has not been on ACEi/ARB/ARNI/MRA due to renal function.   NSTEMI History of Severe Multivessel CAD - Patient diagnosed with severe 3 vessel CAD in 01/2020. Cardiac MRI at that time showed viable Myocardium. She was being worked up for CABG in  [place] but did not go through with this.  Prefers to stay here for procedure - High-sensitivity troponin peaked at 2,156. - Contineu Aspirin and Plavix. - Continue statin and Zetia. - plan for rt and left cardiac Friday at 0900 with Dr. Burt Knack per Dr. Laurene Footman recommendations   Hypertension - controlled.  Hyperlipidemia -  Lipid panel this admission: Total Cholesterol 253, Triglycerides 52, HDL 66, LDL 177.  - LDL goal <70 given CAD. - On Pravastatin and Zetia at home. Will stop Pravastatin and start Lipitor 80mg  daily. Continue Zetia. - Will need repeat lipid panel and LFTs in 2 months.  Acute on CKD Stage III - Creatinine 1.8 but elevated today at 2.04 and k+ 5.5 lasix continued and metolazone held. - lokelma ordered   Prolonged QTc  - QTc 532 ms on EKG today. - Potassium 5.5 - Magnesium 1.9.   Otherwise, per primary team: - Type 2 diabetes mellitus with hyperglycemia on SSI per IM - Acidosis - CVA in 2018   For questions or updates, please contact [place] Please consult [email or web address] for contact info under     Signed, Cecilie Kicks, NP  09/23/2020, 8:21 AM    The patient was seen, examined and discussed with Cecilie Kicks, NP and I agree with the above.   The patient has decided to stay at [facility] for further management, since her last cardiac catheterization was last year in July will need to repeat it, her options are CABG and AVR versus TAVR plus PCI, and worried that deteriorated a whole lot since she was diagnosed with CAD in aortic stenosis last year in July, her LVEF has decreased from 45 to 25-30%, she has also lost significant amount of weight. She is not responding to diuretics and has worsening kidney function 1.86 ->2.04, I will place a central line, order Coox and start on inotrope infusion to help her with diuresis, and also in the perioperative period.  I will hold metolazone and Lasix for now.  Ena Dawley, MD 09/23/2020

## 2020-09-23 NOTE — Plan of Care (Signed)

## 2020-09-23 NOTE — Progress Notes (Signed)
   Subjective:   No acute overnight events.  Reports feeling good today. She states that she has been going to pee frequently, including overnight.   Discussed possible plan for heart catheterization on Friday.  Objective:  Vital signs in last 24 hours: Vitals:   09/22/20 1713 09/22/20 2134 09/23/20 0406 09/23/20 1119  BP: 129/64 106/65 114/62 (!) 102/58  Pulse: 82 85 89 87  Resp: 18 19 20 20   Temp: 98.9 F (37.2 C) 98.3 F (36.8 C) 98 F (36.7 C) 98.4 F (36.9 C)  TempSrc: Oral Axillary Axillary Oral  SpO2:  96% 100% 99%  Weight:   62.1 kg   Height:       General: elderly female, sitting up in chair eating meal, NAD. CV: normal rate and regular rhythm, parasternal heave present. Pulm: mild bibasilar crackles on exam. MSK: trace edema in bilateral lower extremities, improved from yesterday. Skin: warm and dry Neuro: AAOx4, no focal deficits noted.   Assessment/Plan:  Principal Problem:   Acute on chronic HFrEF (heart failure with reduced ejection fraction) (HCC) Active Problems:   NSTEMI (non-ST elevated myocardial infarction) ([place])   Diabetes mellitus ([place])   Essential hypertension   Hyperlipidemia   CKD (chronic kidney disease), stage III ([place])  70 year old person with ischemic heart disease, T2DM, HTN, HLD, and CKD stage IIIb admitted for management of acute on chronic HFrEF, now awaiting evaluation for possible inpatient CABG.  Acute on chronic systolic heart failure (LVEF 25-30%) Cardiology following, appreciate recommendations. Patient appears close to being euvolemic on my exam. She has mild bibasilar crackles (improved from yesterday) and her lower extremity edema has improved significantly with only trace edema noted bilaterally. UOP in I/O's does not reflect actual output as patient has many unmeasured occurrences (spoke with patient to inform nurses every time she goes). As per cardiology, will be holding lasix and metolazone today. CT surgery consulted,  appreciate recommendations. CT surgery recommending cardiac catheterization, with likely plan for it to happen this Friday. Patient's options are CABG plus AVR versus TAVR plus PCI. Continuing hydralazine, imdur, and norvasc.  Type 2 NSTEMI Hx of Severe multivessel CAD HTN HLD Likely etiology from volume overload and hypoxia. Still with no complaints of chest pain. Off of IV heparin. Continue ASA, plavix, lipitor, and zetia. Avoid beta-blockers in the setting of decompensated CHF.  CKD stage IIIb Baseline Cr ~1.5 with GFR ~41. Creatinine worsening (1.86 > 2.04), likely from over diuresis. Holding lasix and metolazone today as per cardiology. Continue trending BMP.  Prolonged QTc Avoid QTc prolonging medications.  Prior to Admission Living Arrangement: Home (in [place], [place]) Anticipated Discharge Location: TBD Barriers to Discharge: continued medical management Dispo: Anticipated discharge in approximately 2-3 day(s).   Virl Axe, MD 09/23/2020, 1:40 PM Pager: [phone] After 5pm on weekdays and 1pm on weekends: On Call pager [phone]

## 2020-09-24 DIAGNOSIS — I214 Non-ST elevation (NSTEMI) myocardial infarction: Secondary | ICD-10-CM | POA: Diagnosis not present

## 2020-09-24 DIAGNOSIS — I5023 Acute on chronic systolic (congestive) heart failure: Secondary | ICD-10-CM | POA: Diagnosis not present

## 2020-09-24 DIAGNOSIS — I21A1 Myocardial infarction type 2: Secondary | ICD-10-CM | POA: Diagnosis not present

## 2020-09-24 DIAGNOSIS — I130 Hypertensive heart and chronic kidney disease with heart failure and stage 1 through stage 4 chronic kidney disease, or unspecified chronic kidney disease: Principal | ICD-10-CM

## 2020-09-24 DIAGNOSIS — E1122 Type 2 diabetes mellitus with diabetic chronic kidney disease: Secondary | ICD-10-CM

## 2020-09-24 DIAGNOSIS — N1832 Chronic kidney disease, stage 3b: Secondary | ICD-10-CM | POA: Diagnosis not present

## 2020-09-24 DIAGNOSIS — N179 Acute kidney failure, unspecified: Secondary | ICD-10-CM | POA: Diagnosis not present

## 2020-09-24 DIAGNOSIS — D631 Anemia in chronic kidney disease: Secondary | ICD-10-CM

## 2020-09-24 DIAGNOSIS — I10 Essential (primary) hypertension: Secondary | ICD-10-CM | POA: Diagnosis not present

## 2020-09-24 DIAGNOSIS — R9431 Abnormal electrocardiogram [ECG] [EKG]: Secondary | ICD-10-CM

## 2020-09-24 LAB — CBC WITH DIFFERENTIAL/PLATELET
Abs Immature Granulocytes: 0.02 10*3/uL (ref 0.00–0.07)
Basophils Absolute: 0.1 10*3/uL (ref 0.0–0.1)
Basophils Relative: 1 %
Eosinophils Absolute: 0.2 10*3/uL (ref 0.0–0.5)
Eosinophils Relative: 3 %
HCT: 25 % — ABNORMAL LOW (ref 36.0–46.0)
Hemoglobin: 8 g/dL — ABNORMAL LOW (ref 12.0–15.0)
Immature Granulocytes: 0 %
Lymphocytes Relative: 37 %
Lymphs Abs: 2.5 10*3/uL (ref 0.7–4.0)
MCH: 30 pg (ref 26.0–34.0)
MCHC: 32 g/dL (ref 30.0–36.0)
MCV: 93.6 fL (ref 80.0–100.0)
Monocytes Absolute: 0.7 10*3/uL (ref 0.1–1.0)
Monocytes Relative: 11 %
Neutro Abs: 3.3 10*3/uL (ref 1.7–7.7)
Neutrophils Relative %: 48 %
Platelets: 384 10*3/uL (ref 150–400)
RBC: 2.67 MIL/uL — ABNORMAL LOW (ref 3.87–5.11)
RDW: 13.5 % (ref 11.5–15.5)
WBC: 6.8 10*3/uL (ref 4.0–10.5)
nRBC: 0 % (ref 0.0–0.2)

## 2020-09-24 LAB — COOXEMETRY PANEL
Carboxyhemoglobin: 1.6 % — ABNORMAL HIGH (ref 0.5–1.5)
Carboxyhemoglobin: 1.4 % (ref 0.5–1.5)
Methemoglobin: 1.1 % (ref 0.0–1.5)
Methemoglobin: 1.1 % (ref 0.0–1.5)
O2 Saturation: 66.5 %
O2 Saturation: 59.1 %
Total hemoglobin: 8.3 g/dL — ABNORMAL LOW (ref 12.0–16.0)
Total hemoglobin: 8.3 g/dL — ABNORMAL LOW (ref 12.0–16.0)

## 2020-09-24 LAB — BASIC METABOLIC PANEL
Anion gap: 6 (ref 5–15)
BUN: 33 mg/dL — ABNORMAL HIGH (ref 8–23)
CO2: 28 mmol/L (ref 22–32)
Calcium: 8.2 mg/dL — ABNORMAL LOW (ref 8.9–10.3)
Chloride: 104 mmol/L (ref 98–111)
Creatinine, Ser: 1.85 mg/dL — ABNORMAL HIGH (ref 0.44–1.00)
GFR, Estimated: 29 mL/min — ABNORMAL LOW (ref >60)
Glucose, Bld: 164 mg/dL — ABNORMAL HIGH (ref 70–99)
Potassium: 4.2 mmol/L (ref 3.5–5.1)
Sodium: 138 mmol/L (ref 135–145)

## 2020-09-24 LAB — GLUCOSE, CAPILLARY
Glucose-Capillary: 293 mg/dL — ABNORMAL HIGH (ref 70–99)
Glucose-Capillary: 242 mg/dL — ABNORMAL HIGH (ref 70–99)
Glucose-Capillary: 383 mg/dL — ABNORMAL HIGH (ref 70–99)
Glucose-Capillary: 178 mg/dL — ABNORMAL HIGH (ref 70–99)

## 2020-09-24 MED ORDER — PREDNISONE 20 MG PO TABS
50.0000 mg | ORAL_TABLET | Freq: Four times a day (QID) | ORAL | Status: AC
  Administered 2020-09-24 – 2020-09-25 (×3): 50 mg via ORAL
  Filled 2020-09-24 (×3): qty 2

## 2020-09-24 MED ORDER — SODIUM CHLORIDE 0.9% FLUSH: 3.0000 mL | INTRAVENOUS | Status: DC | PRN

## 2020-09-24 MED ORDER — DIPHENHYDRAMINE HCL 25 MG PO CAPS
50.0000 mg | ORAL_CAPSULE | Freq: Once | ORAL | Status: AC
  Administered 2020-09-25: 50 mg via ORAL
  Filled 2020-09-24: qty 2

## 2020-09-24 MED ORDER — SODIUM CHLORIDE 0.9 % IV SOLN: 250.0000 mL | INTRAVENOUS | Status: DC | PRN

## 2020-09-24 MED ORDER — SODIUM CHLORIDE 0.9% FLUSH
3.0000 mL | Freq: Two times a day (BID) | INTRAVENOUS | Status: DC
  Administered 2020-09-24 – 2020-09-28 (×5): 3 mL via INTRAVENOUS

## 2020-09-24 MED ORDER — ASPIRIN 81 MG PO CHEW
81.0000 mg | CHEWABLE_TABLET | ORAL | Status: AC
  Administered 2020-09-25: 81 mg via ORAL
  Filled 2020-09-24: qty 1

## 2020-09-24 MED ORDER — SODIUM CHLORIDE 0.9 % IV SOLN: INTRAVENOUS | Status: DC

## 2020-09-24 MED ORDER — INSULIN GLARGINE 100 UNIT/ML ~~LOC~~ SOLN
12.0000 [IU] | Freq: Every day | SUBCUTANEOUS | Status: DC
  Administered 2020-09-25 – 2020-10-03 (×8): 12 [IU] via SUBCUTANEOUS
  Filled 2020-09-24 (×11): qty 0.12

## 2020-09-24 MED ORDER — DIPHENHYDRAMINE HCL 50 MG/ML IJ SOLN: 50.0000 mg | Freq: Once | INTRAMUSCULAR | Status: AC

## 2020-09-24 MED ORDER — MILRINONE LACTATE IN DEXTROSE 20-5 MG/100ML-% IV SOLN
0.1250 ug/kg/min | INTRAVENOUS | Status: DC
  Administered 2020-09-24 (×2): 0.25 ug/kg/min via INTRAVENOUS
  Filled 2020-09-24 (×3): qty 100

## 2020-09-24 NOTE — Progress Notes (Signed)
Inpatient Diabetes Program Recommendations  AACE/ADA: New Consensus Statement on Inpatient Glycemic Control (2015)  Target Ranges:  Prepandial:   less than 140 mg/dL      Peak postprandial:   less than 180 mg/dL (1-2 hours)      Critically ill patients:  140 - 180 mg/dL   Lab Results  Component Value Date   GLUCAP 242 (H) 09/24/2020   HGBA1C 9.3 (H) 09/27/2020    Review of Glycemic Control Results for Sara, [name] (MRN [id number]) as of 09/24/2020 13:40  Ref. Range 09/23/2020 11:17 09/23/2020 16:31 09/23/2020 21:00 09/24/2020 08:27 09/24/2020 11:45  Glucose-Capillary Latest Ref Range: 70 - 99 mg/dL 168 (H) 184 (H) 114 (H) 293 (H) 242 (H)   Diabetes history: DM 2 Outpatient Diabetes medications: Glucotrol XL 10 mg bid, Janumet XR [phone] mg daily Current orders for Inpatient glycemic control:  Novolog moderate tid with meals and HS Novolog 3 units tid with meals Lantus 7 units Inpatient Diabetes Program Recommendations:   Please consider increasing Lantus to 12 units daily.    Thanks,  Adah Perl, RN, BC-ADM Inpatient Diabetes Coordinator Pager [phone] (8a-5p)

## 2020-09-24 NOTE — Progress Notes (Signed)
Nutrition Follow-up  DOCUMENTATION CODES:   Not applicable  INTERVENTION:  Provide Ensure Enlive po BID, each supplement provides 350 kcal and 20 grams of protein.  Encourage adequate PO intake.   NUTRITION DIAGNOSIS:   Increased nutrient needs related to chronic illness (CHF) as evidenced by estimated needs; ongoing  GOAL:   Patient will meet greater than or equal to 90% of their needs; progressing  MONITOR:   PO intake,Supplement acceptance,Skin,Weight trends,Labs,I & O's  REASON FOR ASSESSMENT:   Malnutrition Screening Tool    ASSESSMENT:   70 year old person with history of congestive heart failure (diagnosed July 2021), hypertension, type 2 diabetes mellitus, and prior right-sided CVA (2018) presenting with worsening shortness of breath over the past couple of weeks. Pt found to have elevated troponin, most consistent with a type II NSTEMI due to volume overload and hypoxia.  Plans for cardiac catheterization tomorrow with possible CABG plus AVR vs TAVR plus PCI. Meal completion has been 75-100%. Pt has been tolerating her PO. Pt currently has Ensure ordered and has been consuming them. RD to continue with current orders to aid in caloric and protein needs. Labs and medications reviewed.   Diet Order:   Diet Order            Diet NPO time specified Except for: Sips with Meds  Diet effective midnight           Diet heart healthy/carb modified Room service appropriate? Yes; Fluid consistency: Thin  Diet effective now                 EDUCATION NEEDS:   Education needs have been addressed  Skin:  Skin Assessment: Reviewed RN Assessment  Last BM:  3/16  Height:   Ht Readings from Last 1 Encounters:  10/01/2020 5\' 2"  (1.575 m)    Weight:   Wt Readings from Last 1 Encounters:  09/24/20 60.8 kg   BMI:  Body mass index is 24.53 kg/m.  Estimated Nutritional Needs:   Kcal:  1800-2000  Protein:  85-95 grams  Fluid:  1.5 L/day  Corrin Parker, MS,  RD, LDN RD pager number/after hours weekend pager number on Amion.

## 2020-09-24 NOTE — Progress Notes (Addendum)
Progress Note  Patient Name: Mirren Gest Date of Encounter: 09/24/2020  [facility] Cardiologist: No primary care provider on file. new Had been seen in [place]   Subjective   No pain, mild SOB.  Inpatient Medications    Scheduled Meds: . aspirin EC  81 mg Oral Daily  . atorvastatin  80 mg Oral Daily  . Chlorhexidine Gluconate Cloth  6 each Topical Daily  . clopidogrel  75 mg Oral Daily  . ezetimibe  10 mg Oral Daily  . feeding supplement  237 mL Oral BID BM  . hydrALAZINE  25 mg Oral Q8H  . insulin aspart  0-15 Units Subcutaneous TID WC  . insulin aspart  0-5 Units Subcutaneous QHS  . insulin aspart  3 Units Subcutaneous TID WC  . insulin glargine  7 Units Subcutaneous Daily  . isosorbide mononitrate  30 mg Oral Daily  . sodium chloride flush  3 mL Intravenous Q12H   Continuous Infusions: . sodium chloride    . milrinone 0.25 mcg/kg/min (09/24/20 1037)   PRN Meds: sodium chloride, acetaminophen, sodium chloride flush, sodium chloride flush   Vital Signs    Vitals:   09/24/20 0516 09/24/20 0535 09/24/20 0636 09/24/20 0836  BP: 113/65   (!) 111/46  Pulse: 91   92  Resp: 18   18  Temp: 98.5 F (36.9 C)   98 F (36.7 C)  TempSrc: Oral   Oral  SpO2: 98%   100%  Weight:  62.2 kg 60.8 kg   Height:        Intake/Output Summary (Last 24 hours) at 09/24/2020 1101 Last data filed at 09/24/2020 2458 Gross per 24 hour  Intake 360 ml  Output 1675 ml  Net -1315 ml   Last 3 Weights 09/24/2020 09/24/2020 09/23/2020  Weight (lbs) 134 lb 1.6 oz 137 lb 2 oz 137 lb  Weight (kg) 60.827 kg 62.2 kg 62.143 kg      Telemetry    SR with PVCs - Personally Reviewed  ECG    No new - Personally Reviewed  Physical Exam  Per Dr. Meda Coffee GEN: No acute distress.   Neck: No JVD Cardiac: RRR, no murmurs, rubs, or gallops.  Respiratory: Clear to auscultation bilaterally. GI: Soft, nontender, non-distended  MS: No edema; No deformity. Neuro:  Nonfocal  Psych: Normal  affect   Labs    High Sensitivity Troponin:   Recent Labs  Lab 09/10/2020 1343 09/23/2020 1608 10/01/2020 2210 09/21/20 0636 09/21/20 0832  TROPONINIHS 1,109* 1,413* 2,156* 1,643* 1,826*      Chemistry Recent Labs  Lab 09/22/20 0237 09/23/20 0313 09/24/20 0551  NA 138 138 138  K 4.2 5.5* 4.2  CL 106 107 104  CO2 24 25 28   GLUCOSE 217* 228* 164*  BUN 32* 36* 33*  CREATININE 1.86* 2.04* 1.85*  CALCIUM 8.5* 8.5* 8.2*  GFRNONAA 29* 26* 29*  ANIONGAP 8 6 6      Hematology Recent Labs  Lab 09/22/20 0237 09/23/20 0313 09/24/20 0551  WBC 8.4 8.3 6.8  RBC 2.85* 2.88* 2.67*  HGB 8.7* 8.5* 8.0*  HCT 26.6* 27.7* 25.0*  MCV 93.3 96.2 93.6  MCH 30.5 29.5 30.0  MCHC 32.7 30.7 32.0  RDW 13.2 13.3 13.5  PLT 379 394 384    BNP Recent Labs  Lab 09/18/2020 0704  BNP 1,649.4*     DDimer No results for input(s): DDIMER in the last 168 hours.   Radiology    [place] EKG SITE RITE  Result Date:  09/23/2020 If Site Rite image not attached, placement could not be confirmed due to current cardiac rhythm.   Cardiac Studies   Echocardiogram 01/26/2020 (Care Everywhere): 1. Mild LV dysfunction. EF is in the 40-45% range. The pattern is  worrisome for CAD.  2. Mild calcific aortic stenosis. Gradients are 20/11 mmHg.  3. Mild LVH _______________  Left Cardiac Catheterization 02/04/2020 (Care Everywhere): Mid LAD 70% stenosis, distal LCx 70% stenosis, mid RCA 80% stenosis, proximal RPDA 50% stenosis, left main unremarkable. _______________  Cardiac MRI 02/06/2020 (Care Everywhere): LVEF 51%, RVEF 67%, LV scar 4%, small subendocardial infarction of basal anterior/mid anterolateral wall (LAD or ramus), small subendocardial infarction of mid inferoseptum (RCA), dysfunctional myocardium in all 3 coronary artery distributions predominantly viable, mild-moderate AS (AVA 1.0cm2, peak gradient 87mmHg) Films in Epic now  Patient Profile     70 y.o. female with a history of 3 vessel  CAD noted on cardiac catheterization in 01/2020, ischemic cardiomyopathy/chronic systolic CHF, stroke, hypertension, diabetes mellitus now admitted for CHF and elevated troponin  Assessment & Plan    Acute on Chronic Systolic CHF Ischemic Cardiomyopathy/Moderate to severe AS - Initially diagnosed in 01/2020 in [place]. LHC at the time showed severe 3 vessel CAD. Cardiac MRI showed viable myocardium. She was scheduled to see a CT Surgeon as an outpatient but did not follow through with this due to concerns of coronavirus. - BNP elevated at 1,649.  - Chest x-ray showed acute pulmonary edema with probable small right pleural effusion - Echo in 01/[facility] showed LVEF of 40-45% with mid septal/apical hypokinesis, and mild diastolic dysfunction. - echo yesterday LVEF 25-30% with mildly dilated left ventricle, severely calcified aortic valve with AVA 0.87 cm2 and DVI 0.31 consistent with moderate to severe aortic stenosis  - with rising Cr lasix and Metolazone 2.5 mg po stopped    however the patient feels better, she ultimately needs CABG and AVR, she now wants to have it done at [facility], Dr. Cyndia Bent reviewed  plan for Rt and Lt cardiac cath on Friday   Today will begin milrinone - co-ox 66 today up from 50.9 last pm.  PICC line did not go in until 1900.    - Started on Hydralazine 25mg  TID and Imdur 30mg  daily.- Has not been on ACEi/ARB/ARNI/MRA due to renal function.  BP 113/65 to 111/46    NSTEMI History of Severe Multivessel CAD - Patient diagnosed with severe 3 vessel CAD in 01/2020. Cardiac MRI at that time showed viable Myocardium. She was being worked up for CABG in [place] but did not go through with this.  Prefers to stay here for procedure - High-sensitivity troponin peaked at 2,156. - Contineu Aspirin and Plavix. - Continue statin and Zetia. - plan for rt and left cardiac Friday at 0900 with Dr. Burt Knack per Dr. Vivi Martens recommendations    Hypertension -controlled.  Hyperlipidemia - Lipid panel this admission: Total Cholesterol 253, Triglycerides 52, HDL 66, LDL 177.  - LDL goal <70 given CAD. - On Pravastatin and Zetia at home. Will stop Pravastatin and start Lipitor 80mg  daily. Continue Zetia. - Will need repeat lipid panel and LFTs in 2 months.  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 Cr at 1.85 and K+ today 4.2  - lokelma given yesterday  Prolonged QTc  - QTc541ms on EKG today. - Potassium5.5 - Magnesium1.9.   Otherwise, per primary team: - Type 2 diabetes mellitus  with hyperglycemia on SSI per IM - Acidosis - CVA in 2018   Anemia today hgb 8.0 was 9.8 on admit.  Per IM  For questions or updates, please contact [place] Please consult [email or web address] for contact info under    Signed, Ena Dawley, MD  09/24/2020, 11:01 AM    The patient was seen, examined and discussed with Cecilie Kicks, NP and I agree with the above.   The patient had central line placed yesterday with Coox  51%, she was started on milrinone infusion 0.25 mcg/kg/min, she diuresed 2 L in last 24 hours with weight down 3 pounds since yesterday and improvement of creatinine from to 1.85.  She has improvement in symptoms, Lokelma was given yesterday for potassium was 5.5 today 4.2.  We consulted CT surgery for consideration of CABG and AVR versus PCI and TAVR, decision based on cardiac cath, I would wait with cardiac cath until her creatinine improves. We will plan for a right and left cardiac catheterization tomorrow, hopefully Crea improves, I will continue to hold diuretics, we will order contrast allergy prep.  Ena Dawley, MD 09/24/2020

## 2020-09-24 NOTE — Care Management Important Message (Signed)
Important Message  Patient Details  Name: Sara [name] MRN: [id number] Date of Birth: 12-31-1950   Medicare Important Message Given:  Yes     Shelda Altes 09/24/2020, 11:34 AM

## 2020-09-24 NOTE — Progress Notes (Addendum)
   Subjective:   PICC line placed yesterday.  Reports feeling good, aside from itching and dry skin, asked if it is okay to use cocoa butter lotion from home.   Has been urinating relatively frequently but has been using bedside commode to help with more accurate I/O's after discussion yesterday.   Discussed plan for possible heart cath on Friday and described details of procedure.   Objective:  Vital signs in last 24 hours: Vitals:   09/24/20 0636 09/24/20 0836 09/24/20 1032 09/24/20 1218  BP:  (!) 111/46 (!) 124/52 124/63  Pulse:  92 97 92  Resp:  18 18 18   Temp:  98 F (36.7 C)  98.4 F (36.9 C)  TempSrc:  Oral  Oral  SpO2:  100% 100% 100%  Weight: 60.8 kg     Height:       General: elderly female, sitting up in chair eating meal, NAD. CV: normal rate and irregularly regular rhythm, parasternal heave present. Pulm: bibasilar crackles on exam. MSK: no peripheral edema bilaterally, improved from yesterday. Skin: warm and dry.   Assessment/Plan:  Principal Problem:   Acute on chronic HFrEF (heart failure with reduced ejection fraction) (HCC) Active Problems:   NSTEMI (non-ST elevated myocardial infarction) ([place])   Diabetes mellitus ([place])   Essential hypertension   Hyperlipidemia   CKD (chronic kidney disease), stage III ([place])  70 year old person with ischemic heart disease, T2DM, HTN, HLD, and CKD stage IIIb admitted for management of acute on chronic HFrEF, now awaiting evaluation for possible cardiac catheterization and inpatient CABG.  Acute on chronic systolic heart failure (LVEF 25-30%) Cardiology following, appreciate recommendations. Lasix and metolazone were held yesterday. UOP of about 3L over past 24 hours. She is continuing to have bibasilar crackles on exam, but her peripheral edema has resolved. PICC line placed and started on milrinone drip per cardiology. Continue holding lasix and metolazone today. Possible plan for cardiac catheterization tomorrow,  but will need to ensure that her kidney function continues to improve to get close to her baseline. CT surgery consulted for consideration of CABG plus AVR versus TAVR plus PCI (based on cardiac cath results). Continuing hydralazine, imdur, and norvasc.  Type 2 NSTEMI Hx of Severe multivessel CAD HTN HLD Likely etiology from volume overload and hypoxia. No complaints of chest pain. Continue ASA, plavix, lipitor, and zetia. Avoid beta-blockers in the setting of decompensated CHF.  Anemia Patient's Hgb slowly trending down, 9.6 on arrival and down to 8 today. Likely in the setting of frequent phlebotomy. No signs of acute blood loss on exam. She is no longer on IV heparin, but she is on ASA and plavix for in the setting of severe multivessel CAD.  -Transfuse patient to maintain Hgb greater than or equal to 8 in the setting of preexisting cardiovascular disease and plan for CABG while inpatient. -trend CBC  Acute on CKD stage IIIb Baseline Cr ~1.5 with GFR ~41. Creatinine slightly improved from 2.04 > 1.85. Hopeful that creatinine will continue to improve while holding diuretics. Will need to continue trending BMPs to monitor for possible contrast-induced nephropathy after undergoing cardiac cath.  Prolonged QTc Avoid QTc prolonging medications.  Prior to Admission Living Arrangement: Home (in [place], [place]) Anticipated Discharge Location: TBD Barriers to Discharge: continued medical management Dispo: Anticipated discharge in approximately 2-3 day(s).   Virl Axe, MD 09/24/2020, 1:27 PM Pager: [phone] After 5pm on weekdays and 1pm on weekends: On Call pager [phone]

## 2020-09-25 ENCOUNTER — Encounter (HOSPITAL_COMMUNITY): Payer: Self-pay | Admitting: Cardiovascular Disease

## 2020-09-25 ENCOUNTER — Inpatient Hospital Stay (HOSPITAL_COMMUNITY): Payer: Medicare Other

## 2020-09-25 ENCOUNTER — Inpatient Hospital Stay (HOSPITAL_COMMUNITY): Admission: EM | Disposition: E | Payer: Self-pay | Source: Home / Self Care | Attending: Internal Medicine

## 2020-09-25 DIAGNOSIS — N1832 Chronic kidney disease, stage 3b: Secondary | ICD-10-CM | POA: Diagnosis not present

## 2020-09-25 DIAGNOSIS — E782 Mixed hyperlipidemia: Secondary | ICD-10-CM

## 2020-09-25 DIAGNOSIS — I130 Hypertensive heart and chronic kidney disease with heart failure and stage 1 through stage 4 chronic kidney disease, or unspecified chronic kidney disease: Secondary | ICD-10-CM | POA: Diagnosis not present

## 2020-09-25 DIAGNOSIS — I5023 Acute on chronic systolic (congestive) heart failure: Secondary | ICD-10-CM

## 2020-09-25 DIAGNOSIS — N179 Acute kidney failure, unspecified: Secondary | ICD-10-CM | POA: Diagnosis not present

## 2020-09-25 DIAGNOSIS — I10 Essential (primary) hypertension: Secondary | ICD-10-CM | POA: Diagnosis not present

## 2020-09-25 DIAGNOSIS — I5043 Acute on chronic combined systolic (congestive) and diastolic (congestive) heart failure: Secondary | ICD-10-CM

## 2020-09-25 DIAGNOSIS — I21A1 Myocardial infarction type 2: Secondary | ICD-10-CM | POA: Diagnosis not present

## 2020-09-25 DIAGNOSIS — I350 Nonrheumatic aortic (valve) stenosis: Secondary | ICD-10-CM

## 2020-09-25 DIAGNOSIS — I2510 Atherosclerotic heart disease of native coronary artery without angina pectoris: Secondary | ICD-10-CM

## 2020-09-25 HISTORY — PX: RIGHT/LEFT HEART CATH AND CORONARY ANGIOGRAPHY: CATH118266

## 2020-09-25 LAB — POCT I-STAT EG7
Acid-base deficit: 6 mmol/L — ABNORMAL HIGH (ref 0.0–2.0)
Bicarbonate: 18.2 mmol/L — ABNORMAL LOW (ref 20.0–28.0)
Calcium, Ion: 1.11 mmol/L — ABNORMAL LOW (ref 1.15–1.40)
HCT: 23 % — ABNORMAL LOW (ref 36.0–46.0)
Hemoglobin: 7.8 g/dL — ABNORMAL LOW (ref 12.0–15.0)
O2 Saturation: 73 %
Potassium: 3.9 mmol/L (ref 3.5–5.1)
Sodium: 137 mmol/L (ref 135–145)
TCO2: 19 mmol/L — ABNORMAL LOW (ref 22–32)
pCO2, Ven: 31.9 mmHg — ABNORMAL LOW (ref 44.0–60.0)
pH, Ven: 7.365 (ref 7.250–7.430)
pO2, Ven: 39 mmHg (ref 32.0–45.0)

## 2020-09-25 LAB — CBC
HCT: 25.6 % — ABNORMAL LOW (ref 36.0–46.0)
Hemoglobin: 8.1 g/dL — ABNORMAL LOW (ref 12.0–15.0)
MCH: 29.9 pg (ref 26.0–34.0)
MCHC: 31.6 g/dL (ref 30.0–36.0)
MCV: 94.5 fL (ref 80.0–100.0)
Platelets: 401 10*3/uL — ABNORMAL HIGH (ref 150–400)
RBC: 2.71 MIL/uL — ABNORMAL LOW (ref 3.87–5.11)
RDW: 13.6 % (ref 11.5–15.5)
WBC: 7.3 10*3/uL (ref 4.0–10.5)
nRBC: 0 % (ref 0.0–0.2)

## 2020-09-25 LAB — GLUCOSE, CAPILLARY
Glucose-Capillary: 458 mg/dL — ABNORMAL HIGH (ref 70–99)
Glucose-Capillary: 334 mg/dL — ABNORMAL HIGH (ref 70–99)
Glucose-Capillary: 322 mg/dL — ABNORMAL HIGH (ref 70–99)
Glucose-Capillary: 240 mg/dL — ABNORMAL HIGH (ref 70–99)
Glucose-Capillary: 249 mg/dL — ABNORMAL HIGH (ref 70–99)

## 2020-09-25 LAB — BASIC METABOLIC PANEL
Anion gap: 9 (ref 5–15)
BUN: 48 mg/dL — ABNORMAL HIGH (ref 8–23)
CO2: 21 mmol/L — ABNORMAL LOW (ref 22–32)
Calcium: 8 mg/dL — ABNORMAL LOW (ref 8.9–10.3)
Chloride: 103 mmol/L (ref 98–111)
Creatinine, Ser: 2.15 mg/dL — ABNORMAL HIGH (ref 0.44–1.00)
GFR, Estimated: 24 mL/min — ABNORMAL LOW (ref >60)
Glucose, Bld: 404 mg/dL — ABNORMAL HIGH (ref 70–99)
Potassium: 4.7 mmol/L (ref 3.5–5.1)
Sodium: 133 mmol/L — ABNORMAL LOW (ref 135–145)

## 2020-09-25 LAB — PROTIME-INR
INR: 1 (ref 0.8–1.2)
Prothrombin Time: 13 seconds (ref 11.4–15.2)

## 2020-09-25 LAB — MAGNESIUM: Magnesium: 2.1 mg/dL (ref 1.7–2.4)

## 2020-09-25 LAB — COOXEMETRY PANEL
Carboxyhemoglobin: 1.4 % (ref 0.5–1.5)
Methemoglobin: 1.2 % (ref 0.0–1.5)
O2 Saturation: 85 %
Total hemoglobin: 8.3 g/dL — ABNORMAL LOW (ref 12.0–16.0)

## 2020-09-25 SURGERY — RIGHT/LEFT HEART CATH AND CORONARY ANGIOGRAPHY
Anesthesia: LOCAL

## 2020-09-25 MED ORDER — HEPARIN (PORCINE) IN NACL 1000-0.9 UT/500ML-% IV SOLN
INTRAVENOUS | Status: DC | PRN
  Administered 2020-09-25 (×2): 500 mL

## 2020-09-25 MED ORDER — LIDOCAINE HCL (PF) 1 % IJ SOLN
INTRAMUSCULAR | Status: DC | PRN
  Administered 2020-09-25: 15 mL

## 2020-09-25 MED ORDER — SODIUM CHLORIDE 0.9 % WEIGHT BASED INFUSION: 1.0000 mL/kg/h | INTRAVENOUS | Status: AC

## 2020-09-25 MED ORDER — VERAPAMIL HCL 2.5 MG/ML IV SOLN
INTRAVENOUS | Status: AC
  Filled 2020-09-25: qty 2

## 2020-09-25 MED ORDER — MIDAZOLAM HCL 2 MG/2ML IJ SOLN
INTRAMUSCULAR | Status: AC
  Filled 2020-09-25: qty 2

## 2020-09-25 MED ORDER — MIDAZOLAM HCL 2 MG/2ML IJ SOLN
INTRAMUSCULAR | Status: DC | PRN
  Administered 2020-09-25 (×2): 1 mg via INTRAVENOUS

## 2020-09-25 MED ORDER — OXYCODONE HCL 5 MG PO TABS
5.0000 mg | ORAL_TABLET | ORAL | Status: AC | PRN
Start: 2020-09-25 — End: 2020-09-25
  Administered 2020-09-25: 5 mg via ORAL
  Filled 2020-09-25: qty 1

## 2020-09-25 MED ORDER — HEPARIN SODIUM (PORCINE) 1000 UNIT/ML IJ SOLN
INTRAMUSCULAR | Status: AC
  Filled 2020-09-25: qty 1

## 2020-09-25 MED ORDER — INSULIN ASPART 100 UNIT/ML ~~LOC~~ SOLN
5.0000 [IU] | Freq: Once | SUBCUTANEOUS | Status: AC
  Administered 2020-09-25: 5 [IU] via SUBCUTANEOUS

## 2020-09-25 MED ORDER — OXYCODONE HCL 5 MG PO TABS
5.0000 mg | ORAL_TABLET | Freq: Two times a day (BID) | ORAL | Status: DC
Start: 2020-09-25 — End: 2020-09-25
  Administered 2020-09-25: 5 mg via ORAL
  Filled 2020-09-25: qty 1

## 2020-09-25 MED ORDER — SODIUM CHLORIDE 0.9% FLUSH
3.0000 mL | Freq: Two times a day (BID) | INTRAVENOUS | Status: DC
  Administered 2020-09-26 – 2020-09-28 (×4): 3 mL via INTRAVENOUS

## 2020-09-25 MED ORDER — ONDANSETRON HCL 4 MG/2ML IJ SOLN
4.0000 mg | Freq: Four times a day (QID) | INTRAMUSCULAR | Status: DC | PRN
  Administered 2020-10-02: 4 mg via INTRAVENOUS
  Filled 2020-09-25: qty 2

## 2020-09-25 MED ORDER — IOHEXOL 350 MG/ML SOLN
INTRAVENOUS | Status: DC | PRN
  Administered 2020-09-25: 20 mL

## 2020-09-25 MED ORDER — HEPARIN (PORCINE) IN NACL 1000-0.9 UT/500ML-% IV SOLN
INTRAVENOUS | Status: AC
  Filled 2020-09-25: qty 1000

## 2020-09-25 MED ORDER — SODIUM CHLORIDE 0.9% FLUSH
3.0000 mL | INTRAVENOUS | Status: DC | PRN
Start: 2020-09-25 — End: 2020-09-29

## 2020-09-25 MED ORDER — LIDOCAINE HCL (PF) 1 % IJ SOLN
INTRAMUSCULAR | Status: AC
  Filled 2020-09-25: qty 30

## 2020-09-25 MED ORDER — FENTANYL CITRATE (PF) 100 MCG/2ML IJ SOLN
INTRAMUSCULAR | Status: AC
  Filled 2020-09-25: qty 2

## 2020-09-25 MED ORDER — HYDRALAZINE HCL 20 MG/ML IJ SOLN: 10.0000 mg | INTRAMUSCULAR | Status: AC | PRN

## 2020-09-25 MED ORDER — FENTANYL CITRATE (PF) 100 MCG/2ML IJ SOLN
INTRAMUSCULAR | Status: DC | PRN
  Administered 2020-09-25 (×3): 25 ug via INTRAVENOUS

## 2020-09-25 MED ORDER — MILRINONE LACTATE IN DEXTROSE 20-5 MG/100ML-% IV SOLN: 0.1250 ug/kg/min | INTRAVENOUS | Status: DC

## 2020-09-25 MED ORDER — HYDROMORPHONE HCL 1 MG/ML IJ SOLN: 0.5000 mg | Freq: Once | INTRAMUSCULAR | Status: DC

## 2020-09-25 MED ORDER — LABETALOL HCL 5 MG/ML IV SOLN
10.0000 mg | INTRAVENOUS | Status: AC | PRN
Start: 2020-09-25 — End: 2020-09-25

## 2020-09-25 MED ORDER — SODIUM CHLORIDE 0.9 % IV SOLN
250.0000 mL | INTRAVENOUS | Status: DC | PRN
  Administered 2020-09-28: 250 mL via INTRAVENOUS

## 2020-09-25 SURGICAL SUPPLY — 17 items
CATH INFINITI 5FR MULTPACK ANG (CATHETERS) ×2 IMPLANT
CATH SWAN GANZ 7F STRAIGHT (CATHETERS) ×2 IMPLANT
CLOSURE MYNX CONTROL 5F (Vascular Products) ×4 IMPLANT
ELECT DEFIB PAD ADLT CADENCE (PAD) ×2 IMPLANT
GLIDESHEATH SLEND SS 6F .021 (SHEATH) ×2 IMPLANT
GUIDEWIRE INQWIRE 1.5J.035X260 (WIRE) ×1 IMPLANT
INQWIRE 1.5J .035X260CM (WIRE) ×2
KIT HEART LEFT (KITS) ×2 IMPLANT
KIT MICROPUNCTURE NIT STIFF (SHEATH) ×2 IMPLANT
PACK CARDIAC CATHETERIZATION (CUSTOM PROCEDURE TRAY) ×2 IMPLANT
SHEATH PINNACLE 5F 10CM (SHEATH) ×2 IMPLANT
SHEATH PINNACLE 7F 10CM (SHEATH) ×2 IMPLANT
SHEATH PROBE COVER 6X72 (BAG) ×2 IMPLANT
TRANSDUCER W/STOPCOCK (MISCELLANEOUS) ×2 IMPLANT
TUBING CIL FLEX 10 FLL-RA (TUBING) ×2 IMPLANT
WIRE EMERALD 3MM-J .035X150CM (WIRE) ×2 IMPLANT
WIRE MICROINTRODUCER 60CM (WIRE) ×2 IMPLANT

## 2020-09-25 NOTE — Progress Notes (Signed)
   Subjective:   Cardiac catheterization performed today.  She has a lot of pain from attempted left brachial cath placement, which was ultimately unsuccessful as per patient. Otherwise, she does not have any pain in right leg, which is the site used for cardiac cath. States that she was told that she will need the heart surgery while she is here.  Spoke and updated son who was at bedside.    Objective:  Vital signs in last 24 hours: Vitals:   10/06/2020 1158 09/18/2020 1214 10/06/2020 1228 10/01/2020 1258  BP: (!) 138/58 125/63 140/63 129/76  Pulse: (!) 107 (!) 104 (!) 108 (!) 109  Resp:      Temp:      TempSrc:      SpO2: 97% 99% 98% 95%  Weight:      Height:       General: elderly female, lying in bed, NAD. CV: tachycardic and irregularly regular rhythm, 3/6 systolic murmur, parasternal heave present. Pulm: anterior auscultation with mild bibasilar crackles. MSK: no peripheral edema bilaterally. Skin: extremities slightly cool and dry.   Assessment/Plan:  Principal Problem:   Acute on chronic HFrEF (heart failure with reduced ejection fraction) (HCC) Active Problems:   NSTEMI (non-ST elevated myocardial infarction) ([place])   Diabetes mellitus ([place])   Essential hypertension   Hyperlipidemia   CKD (chronic kidney disease), stage III ([place])  70 year old person with ischemic heart disease, T2DM, HTN, HLD, and CKD stage IIIb admitted for management of acute on chronic HFrEF, now s/p cardiac catheterization and awaiting inpatient CABG.  Acute on chronic systolic heart failure (LVEF 25-30%) Cardiology following, appreciate recommendations. Lasix and metolazone being held at this time. Continuing on milrinone drip per cardiology. Left and right heart catheterization performed today. CT surgery following, greatly appreciate recommendations. Recommending CABG and aortic valve replacement. However, patient will need dental consultation to extract remaining teeth prior to CABG. Will need to  monitor for stabilization of creatinine in the setting of recent cardiac catheterization to assess for possible contrast-induced nephropathy. Discontinued plavix in anticipation for CABG and AVR. Overall, appears that CABG and AVR will likely occur late next week. Continuing hydralazine, imdur, and norvasc.  Type 2 NSTEMI Hx of Severe multivessel CAD HTN HLD Likely etiology from volume overload and hypoxia. No complaints of chest pain. Continue ASA, lipitor, and zetia. Plavix discontinued in anticipation for CABG and AVR next week. Avoid beta-blockers in the setting of decompensated CHF.  Anemia Hgb stable, 8.0 > 8.1. She is no longer on IV heparin and plavix was stopped, but she is on ASA in the setting of severe multivessel CAD. Transfuse patient to maintain Hgb greater than or equal to 8 in the setting of preexisting cardiovascular disease and plan for CABG with AVR while inpatient. Will continue to trend CBC.  Acute on CKD stage IIIb Baseline Cr ~1.5 with GFR ~41. Creatinine worsened since yesterday, 1.85 > 2.15. Will need to continue trending BMPs to monitor for possible contrast-induced nephropathy after having underwent cardiac cath.  Prolonged QTc Avoid QTc prolonging medications.  Prior to Admission Living Arrangement: Home (in [place], [place]) Anticipated Discharge Location: TBD Barriers to Discharge: continued medical management Dispo: Anticipated discharge after CABG and AVR.   Virl Axe, MD 09/29/2020, 2:34 PM Pager: [phone] After 5pm on weekdays and 1pm on weekends: On Call pager [phone]

## 2020-09-25 NOTE — H&P (View-Only) (Signed)
Progress Note  Patient Name: Sara [name] Date of Encounter: 09/16/2020  Primary Cardiologist: Previously followed by [facility]   Having a lot of left shoulder pain this morning. Unclear etiology. She states this started after getting a second IV placed in her left upper arm. Creatinine worse today at 2.15>>from 1.85 yesterday  Inpatient Medications    Scheduled Meds: . aspirin EC  81 mg Oral Daily  . atorvastatin  80 mg Oral Daily  . Chlorhexidine Gluconate Cloth  6 each Topical Daily  . clopidogrel  75 mg Oral Daily  . ezetimibe  10 mg Oral Daily  . feeding supplement  237 mL Oral BID BM  . hydrALAZINE  25 mg Oral Q8H  . insulin aspart  0-15 Units Subcutaneous TID WC  . insulin aspart  0-5 Units Subcutaneous QHS  . insulin aspart  3 Units Subcutaneous TID WC  . insulin glargine  12 Units Subcutaneous Daily  . isosorbide mononitrate  30 mg Oral Daily  . sodium chloride flush  3 mL Intravenous Q12H  . sodium chloride flush  3 mL Intravenous Q12H   Continuous Infusions: . sodium chloride 10 mL/hr at 09/24/20 1928  . sodium chloride    . sodium chloride    . milrinone 0.25 mcg/kg/min (09/24/20 2118)   PRN Meds: sodium chloride, sodium chloride, acetaminophen, sodium chloride flush, sodium chloride flush, sodium chloride flush   Vital Signs    Vitals:   09/24/20 2100 10/05/2020 0055 09/10/2020 0359 09/17/2020 0419  BP: (!) 110/48 133/61 (!) 112/57   Pulse: (!) 54 100 (!) 104   Resp: 16 16    Temp: 98.9 F (37.2 C) 98 F (36.7 C) 98.6 F (37 C)   TempSrc: Oral Oral Oral   SpO2: 96% 100% 98%   Weight:    62.6 kg  Height:        Intake/Output Summary (Last 24 hours) at 09/14/2020 0733 Last data filed at 09/24/2020 2000 Gross per 24 hour  Intake 1202.5 ml  Output 750 ml  Net 452.5 ml   Filed Weights   09/24/20 0535 09/24/20 0636 09/30/2020 0419  Weight: 62.2 kg 60.8 kg 62.6 kg    Physical Exam   General: Having localized left shoulder  pain Neck: Negative for carotid bruits. No JVD Lungs:Clear to ausculation bilaterally. No wheezes, rales, or rhonchi. Breathing is unlabored. Cardiovascular: RRR with S1 S2. + murmur Abdomen: Soft, non-tender, non-distended. No obvious abdominal masses. Extremities: No edema. No clubbing or cyanosis. DP/PT pulses 2+ bilaterally Neuro: Alert and oriented Psych: Responds to questions appropriately with normal affect.    Labs    Chemistry Recent Labs  Lab 09/23/20 0313 09/24/20 0551 10/06/2020 0400  NA 138 138 133*  K 5.5* 4.2 4.7  CL 107 104 103  CO2 25 28 21*  GLUCOSE 228* 164* 404*  BUN 36* 33* 48*  CREATININE 2.04* 1.85* 2.15*  CALCIUM 8.5* 8.2* 8.0*  GFRNONAA 26* 29* 24*  ANIONGAP 6 6 9      Hematology Recent Labs  Lab 09/23/20 0313 09/24/20 0551 09/28/2020 0400  WBC 8.3 6.8 7.3  RBC 2.88* 2.67* 2.71*  HGB 8.5* 8.0* 8.1*  HCT 27.7* 25.0* 25.6*  MCV 96.2 93.6 94.5  MCH 29.5 30.0 29.9  MCHC 30.7 32.0 31.6  RDW 13.3 13.5 13.6  PLT 394 384 401*    Cardiac EnzymesNo results for input(s): TROPONINI in the last 168 hours. No results for input(s): TROPIPOC in the last 168 hours.  BNP Recent Labs  Lab 09/19/2020 0704  BNP 1,649.4*     DDimer No results for input(s): DDIMER in the last 168 hours.   Radiology    [place] EKG SITE RITE  Result Date: 09/23/2020 If Site Rite image not attached, placement could not be confirmed due to current cardiac rhythm.  Telemetry    09/12/2020 ST with episodes of ventricular bigeminy and PVCs  - Personally Reviewed  ECG    No new tracing as of 09/20/2020- Personally Reviewed  Cardiac Studies   Echocardiogram 01/26/2020 (Care Everywhere): 1. Mild LV dysfunction. EF is in the 40-45% range. The pattern is  worrisome for CAD.  2. Mild calcific aortic stenosis. Gradients are 20/11 mmHg.  3. Mild LVH _______________  Left Cardiac Catheterization 02/04/2020 (Care Everywhere): Mid LAD 70% stenosis, distal LCx 70% stenosis, mid RCA  80% stenosis, proximal RPDA 50% stenosis, left main unremarkable. _______________  Cardiac MRI 02/06/2020 (Care Everywhere): LVEF 51%, RVEF 67%, LV scar 4%, small subendocardial infarction of basal anterior/mid anterolateral wall (LAD or ramus), small subendocardial infarction of mid inferoseptum (RCA), dysfunctional myocardium in all 3 coronary artery distributions predominantly viable, mild-moderate AS (AVA 1.0cm2, peak gradient 32mmHg) Films in Epic now  Patient Profile     70 y.o. female history of 3 vessel CAD noted on cardiac catheterization in 01/2020, ischemic cardiomyopathy/chronic systolic CHF, stroke, hypertension, diabetes mellitusnow admitted for CHF and elevated troponin  Assessment & Plan    1. Acute on chronic CHF with ischemic cardiomyopathy: -Echo performed 09/24/20 with LVEF at 25-30% with mildly dilated ventricle, severely calcified aortic valve with AVA 0.87 cm2 and DVI 0.31 consistent with moderate to severe aortic stenosis>>she has known multi vessel CAD with prior plan for CABG while in [place] however this was never performed. She ultimately needs CABG with AVR versus PCI with TAVR with decision based on LHC today  -Started on milrinone with last Co-OX at 66>>85 today -Started on hydralazine 25 TID and Imdur 30 QD -Unable to start ACE/ARN/ARNI due to renal dysfunction   2. Moderate to severe AS: -Echo as above with LVEF at 25-30% with mildly dilated ventricle, severely calcified aortic valve with AVA 0.87 cm2 and DVI 0.31 consistent with moderate to severe aortic stenosis>plan for [facility] today>>workup for CABG/AVR versus PCI with TAVR   3. NSTEMI with known multivessel CAD: -Found to have 3VD 01/2020>>s/p cMRI with viable myocardium with workup for CABG in [place] however never performed -HsT peak at 2156 this admission  -Continue ASA, Plavix, statin and Zetia  -Plan for [facility] today for possible intervention versus CABG  4. HTN: -Stable,  112/57>>133/61>>110/48 -Continue current regimen   5. HLD: -LDL noted to be 177 on labs this admission  -On PTA Pravastatin and Zetia>>transitioned to Atorvastatin 80 QD -Repeat panel in 6-8 weeks with LFT  6. Acute on chronic kidney disease stage IV: -Cr, 2.125 today>>up from 1.85 yesterday>>wiull notify lab team  -Unable to use ACEI/ARB/ARNI due to poor renal function  -Metolazone held yesterday due to rise in Cr -K+ elevated yesterday as well>>treated with Lokelma  7. Acute left shoulder pain: -Reports pain began after IV insertion this AM>>>no acute change on assessment   8. PVC/ventricular bigeminy: -Noted on telemetry>K+ 4.7>check Mg+ as add on to AM lab work   Signed, Kathyrn Drown NP-[facility] [place] Pager: [phone] 09/24/2020, 7:33 AM     For questions or updates, please contact   Please consult [email or web address] for contact info under Cardiology/STEMI.  The patient was seen, examined and discussed with  Kathyrn Drown, NP  and I agree with the above.   The patient had central line placed yesterday with Coox  51->85%%, CVP 7, she was started on milrinone infusion 0.25 mcg/kg/min, I will decrease to 0.125 mcg/kg/min, she diuresed 2 L in last 24 hours with weight down 3 pounds since yesterday and improvement of creatinine from to 1.85, now up to 2.15. He was prepped for cardiac cath today as she has contrast allergy, despite creatinine increased we decided to proceed with low contrast diagnostic cath and right-sided cath, We will monitor closely for contrast nephropathy.  Ena Dawley, MD 10/01/2020

## 2020-09-25 NOTE — Consult Note (Signed)
[address]       [place] [address]             [phone]      Cardiothoracic Surgery Consultation   Reason for Consult: Severe multivessel coronary disease and moderate to severe aortic stenosis Referring Physician: Dr. Liane Comber  Sara [name] is an 70 y.o. female.  HPI:   The patient is a 70 year old woman with history of hypertension, type 2 diabetes, stage III-IV chronic kidney disease, prior stroke in 2018 coronary artery disease and congestive heart failure who presented with a several week history of progressively worsening shortness of breath, peripheral edema, and orthopnea.  She has history of coronary artery disease and congestive heart failure dating back to July 2021.  She was living in [place] at that time with her daughter and cardiac catheterization showed severe three-vessel coronary disease with ischemic cardiomyopathy with ejection fraction of 40 to 45%.  We do not have her films from that time but report showed 70% mid LAD, 70% distal left circumflex, 80% mid right coronary artery, and 50% right posterior descending stenosis.  She underwent cardiac MRI which showed only 4% nonviable myocardium and coronary bypass surgery was recommended.  There was a small subendocardial infarct in the basal anterior/mid anterolateral wall (LAD or ramus distribution) as well as a small subendocardial infarction in the mid inferior septum in the (RCA distribution).  Due to the COVID-19 pandemic she did not follow-up with the cardiac surgeon and never had surgery.  Her high-sensitivity troponin on presentation was 68 increasing to a peak of 2156.  BNP was 1649.  2D echocardiogram showed left ventricular ejection fraction of 25 to 30% with a mildly dilated left ventricle.  There was a severely calcified aortic valve with an aortic valve area of 0.87 cm and dimensionless index of 0.31.  The mean gradient across aortic valve was 11.8 mmHg with a peak of 20.6 mmHg.   Stroke-volume index was low at 25.  She was diuresed.  Co-ox was 51 and she was started on milrinone with an increase in the Co-ox to 66.  Cardiac catheterization this morning showed severe three-vessel coronary disease with 95% LAD stenosis after the diagonal branch which had 90% ostial stenosis and was small and diffusely diseased.  The left circumflex had 75% proximal stenosis and 75% distal stenosis.  The RCA had a long 80% mid vessel stenosis.  The posterior descending and posterior lateral branches were small and had 80% proximal stenosis.  PA pressure was 29/10 with a wedge pressure of 12.  LVEDP was 19.  PA oxygen saturation was 73%.  Cardiac index was 5 L/min/m.  The mean gradient across the aortic valve is 9 mmHg the peak to peak gradient 16 mmHg.  The patient is now living locally with her son and plans on staying here.  She smokes about 1/2 pack of cigarettes per day.  She has not seen a dentist in years and has multiple missing teeth.  Past Medical History:  Diagnosis Date  . CHF (congestive heart failure) ([facility])   . Diabetes mellitus without complication ([place])   . Hypertension   . Stroke [facility]) 2018   Reports right sided stroke, continues to have some slurred speech, but no motor defecients     Past Surgical History:  Procedure Laterality Date  . CAROTID ENDARTERECTOMY    . RIGHT/LEFT HEART CATH AND CORONARY ANGIOGRAPHY N/A 09/12/2020   Procedure: RIGHT/LEFT HEART CATH AND CORONARY ANGIOGRAPHY;  Surgeon: Sherren Mocha, MD;  Location: [facility];  Service: Cardiovascular;  Laterality: N/A;    History reviewed. No pertinent family history.  Social History:  reports that she has been smoking cigarettes. She started smoking about 50 years ago. She has been smoking about 0.50 packs per day. She has never used smokeless tobacco. She reports previous alcohol use. She reports that she does not use drugs.  Allergies:  Allergies  Allergen Reactions  . Contrast Media [Iodinated  Diagnostic Agents]   . Naproxen Nausea And Vomiting    Medications:  I have reviewed the patient's current medications. Prior to Admission:  Medications Prior to Admission  Medication Sig Dispense Refill Last Dose  . amLODipine (NORVASC) 10 MG tablet Take 10 mg by mouth daily.   09/19/2020 at Unknown time  . aspirin EC 81 MG tablet Take 81 mg by mouth daily. Swallow whole.   09/19/2020 at Unknown time  . cholecalciferol (VITAMIN D3) 25 MCG (1000 UNIT) tablet Take 1,000 Units by mouth daily. 3 times weekly.  Sunday,Tuesda & Friday   09/19/2020 at Unknown time  . clopidogrel (PLAVIX) 75 MG tablet Take 75 mg by mouth daily.   09/19/2020 at Unknown time  . ezetimibe (ZETIA) 10 MG tablet Take 10 mg by mouth daily.   09/19/2020 at Unknown time  . glipiZIDE (GLUCOTROL XL) 10 MG 24 hr tablet Take 10 mg by mouth 2 (two) times daily.   09/19/2020 at Unknown time  . Multiple Vitamins-Minerals (MULTIVITAMIN WITH MINERALS) tablet Take 1 tablet by mouth daily.   09/19/2020 at Unknown time  . pravastatin (PRAVACHOL) 40 MG tablet Take 40 mg by mouth daily.   09/19/2020 at Unknown time  . SitaGLIPtin-MetFORMIN HCl (JANUMET XR) [phone] MG TB24 Take 0.5 tablets by mouth daily.   09/19/2020 at Unknown time   Scheduled: . aspirin EC  81 mg Oral Daily  . atorvastatin  80 mg Oral Daily  . Chlorhexidine Gluconate Cloth  6 each Topical Daily  . ezetimibe  10 mg Oral Daily  . feeding supplement  237 mL Oral BID BM  . hydrALAZINE  25 mg Oral Q8H  . insulin aspart  0-15 Units Subcutaneous TID WC  . insulin aspart  0-5 Units Subcutaneous QHS  . insulin aspart  3 Units Subcutaneous TID WC  . insulin glargine  12 Units Subcutaneous Daily  . isosorbide mononitrate  30 mg Oral Daily  . oxyCODONE  5 mg Oral BID  . sodium chloride flush  3 mL Intravenous Q12H  . sodium chloride flush  3 mL Intravenous Q12H  . sodium chloride flush  3 mL Intravenous Q12H   Continuous: . sodium chloride 10 mL/hr at 09/24/20 1928  . sodium  chloride    . sodium chloride    . milrinone 0.25 mcg/kg/min (09/24/20 2118)   WVP:XTGGYI chloride, sodium chloride, acetaminophen, hydrALAZINE, labetalol, ondansetron (ZOFRAN) IV, sodium chloride flush, sodium chloride flush, sodium chloride flush Anti-infectives (From admission, onward)   None      Results for orders placed or performed during the hospital encounter of 09/13/2020 (from the past 48 hour(s))  Glucose, capillary     Status: Abnormal   Collection Time: 09/23/20  4:31 PM  Result Value Ref Range   Glucose-Capillary 184 (H) 70 - 99 mg/dL    Comment: Glucose reference range applies only to samples taken after fasting for at least 8 hours.  .Cooxemetry Panel (carboxy, met, total hgb, O2 sat)     Status: Abnormal  Collection Time: 09/23/20  6:50 PM  Result Value Ref Range   Total hemoglobin 8.3 (L) 12.0 - 16.0 g/dL   O2 Saturation 50.9 %   Carboxyhemoglobin 1.2 0.5 - 1.5 %   Methemoglobin 1.2 0.0 - 1.5 %    Comment: Performed at [facility] [address] [place], [place] [address]  Glucose, capillary     Status: Abnormal   Collection Time: 09/23/20  9:00 PM  Result Value Ref Range   Glucose-Capillary 114 (H) 70 - 99 mg/dL    Comment: Glucose reference range applies only to samples taken after fasting for at least 8 hours.  .Cooxemetry Panel (carboxy, met, total hgb, O2 sat)     Status: Abnormal   Collection Time: 09/24/20  5:24 AM  Result Value Ref Range   Total hemoglobin 8.3 (L) 12.0 - 16.0 g/dL   O2 Saturation 66.5 %   Carboxyhemoglobin 1.6 (H) 0.5 - 1.5 %   Methemoglobin 1.1 0.0 - 1.5 %    Comment: Performed at [facility] [address], [place], [place] [address]  Basic metabolic panel     Status: Abnormal   Collection Time: 09/24/20  5:51 AM  Result Value Ref Range   Sodium 138 135 - 145 mmol/L   Potassium 4.2 3.5 - 5.1 mmol/L   Chloride 104 98 - 111 mmol/L   CO2 28 22 - 32 mmol/L   Glucose, Bld 164 (H) 70 - 99 mg/dL    Comment: Glucose  reference range applies only to samples taken after fasting for at least 8 hours.   BUN 33 (H) 8 - 23 mg/dL   Creatinine, Ser 1.85 (H) 0.44 - 1.00 mg/dL   Calcium 8.2 (L) 8.9 - 10.3 mg/dL   GFR, Estimated 29 (L) >60 mL/min    Comment: (NOTE) Calculated using the CKD-EPI Creatinine Equation (2021)    Anion gap 6 5 - 15    Comment: Performed at [facility] [address] [place], [place] [address]  CBC with Differential/Platelet     Status: Abnormal   Collection Time: 09/24/20  5:51 AM  Result Value Ref Range   WBC 6.8 4.0 - 10.5 K/uL   RBC 2.67 (L) 3.87 - 5.11 MIL/uL   Hemoglobin 8.0 (L) 12.0 - 15.0 g/dL   HCT 25.0 (L) 36.0 - 46.0 %   MCV 93.6 80.0 - 100.0 fL   MCH 30.0 26.0 - 34.0 pg   MCHC 32.0 30.0 - 36.0 g/dL   RDW 13.5 11.5 - 15.5 %   Platelets 384 150 - 400 K/uL   nRBC 0.0 0.0 - 0.2 %   Neutrophils Relative % 48 %   Neutro Abs 3.3 1.7 - 7.7 K/uL   Lymphocytes Relative 37 %   Lymphs Abs 2.5 0.7 - 4.0 K/uL   Monocytes Relative 11 %   Monocytes Absolute 0.7 0.1 - 1.0 K/uL   Eosinophils Relative 3 %   Eosinophils Absolute 0.2 0.0 - 0.5 K/uL   Basophils Relative 1 %   Basophils Absolute 0.1 0.0 - 0.1 K/uL   Immature Granulocytes 0 %   Abs Immature Granulocytes 0.02 0.00 - 0.07 K/uL    Comment: Performed at [facility], [address], [place], [place] [address]  Glucose, capillary     Status: Abnormal   Collection Time: 09/24/20  8:27 AM  Result Value Ref Range   Glucose-Capillary 293 (H) 70 - 99 mg/dL    Comment: Glucose reference range  applies only to samples taken after fasting for at least 8 hours.  Glucose, capillary     Status: Abnormal   Collection Time: 09/24/20 11:45 AM  Result Value Ref Range   Glucose-Capillary 242 (H) 70 - 99 mg/dL    Comment: Glucose reference range applies only to samples taken after fasting for at least 8 hours.  Cooxemetry Panel (carboxy, met, total hgb, O2 sat)     Status: Abnormal   Collection Time: 09/24/20  2:10 PM   Result Value Ref Range   Total hemoglobin 8.3 (L) 12.0 - 16.0 g/dL   O2 Saturation 59.1 %   Carboxyhemoglobin 1.4 0.5 - 1.5 %   Methemoglobin 1.1 0.0 - 1.5 %    Comment: Performed at [facility] [address] [place], [place] [address]  Glucose, capillary     Status: Abnormal   Collection Time: 09/24/20  4:34 PM  Result Value Ref Range   Glucose-Capillary 383 (H) 70 - 99 mg/dL    Comment: Glucose reference range applies only to samples taken after fasting for at least 8 hours.  Glucose, capillary     Status: Abnormal   Collection Time: 09/24/20  9:09 PM  Result Value Ref Range   Glucose-Capillary 178 (H) 70 - 99 mg/dL    Comment: Glucose reference range applies only to samples taken after fasting for at least 8 hours.  Cooxemetry Panel (carboxy, met, total hgb, O2 sat)     Status: Abnormal   Collection Time: 09/30/2020  3:53 AM  Result Value Ref Range   Total hemoglobin 8.3 (L) 12.0 - 16.0 g/dL   O2 Saturation 85.0 %   Carboxyhemoglobin 1.4 0.5 - 1.5 %   Methemoglobin 1.2 0.0 - 1.5 %    Comment: Performed at [facility], [place] [address] [place], [place] [address]  Basic metabolic panel     Status: Abnormal   Collection Time: 10/06/2020  4:00 AM  Result Value Ref Range   Sodium 133 (L) 135 - 145 mmol/L   Potassium 4.7 3.5 - 5.1 mmol/L   Chloride 103 98 - 111 mmol/L   CO2 21 (L) 22 - 32 mmol/L   Glucose, Bld 404 (H) 70 - 99 mg/dL    Comment: Glucose reference range applies only to samples taken after fasting for at least 8 hours.   BUN 48 (H) 8 - 23 mg/dL   Creatinine, Ser 2.15 (H) 0.44 - 1.00 mg/dL   Calcium 8.0 (L) 8.9 - 10.3 mg/dL   GFR, Estimated 24 (L) >60 mL/min    Comment: (NOTE) Calculated using the CKD-EPI Creatinine Equation (2021)    Anion gap 9 5 - 15    Comment: Performed at [facility] [address], [place], [place] [address]  Protime-INR     Status: None   Collection Time: 09/29/2020  4:00 AM  Result Value Ref Range   Prothrombin Time  13.0 11.4 - 15.2 seconds   INR 1.0 0.8 - 1.2    Comment: (NOTE) INR goal varies based on device and disease states. Performed at [facility], [place] [address] [place] [address]   CBC     Status: Abnormal   Collection Time: 09/14/2020  4:00 AM  Result Value Ref Range   WBC 7.3 4.0 - 10.5 K/uL   RBC 2.71 (L) 3.87 - 5.11 MIL/uL   Hemoglobin 8.1 (L) 12.0 - 15.0 g/dL   HCT 25.6 (L) 36.0 - 46.0 %  MCV 94.5 80.0 - 100.0 fL   MCH 29.9 26.0 - 34.0 pg   MCHC 31.6 30.0 - 36.0 g/dL   RDW 13.6 11.5 - 15.5 %   Platelets 401 (H) 150 - 400 K/uL   nRBC 0.0 0.0 - 0.2 %    Comment: Performed at [facility] [address] [place], [place] [address]  Glucose, capillary     Status: Abnormal   Collection Time: 09/14/2020  8:14 AM  Result Value Ref Range   Glucose-Capillary 458 (H) 70 - 99 mg/dL    Comment: Glucose reference range applies only to samples taken after fasting for at least 8 hours.    CARDIAC CATHETERIZATION  Result Date: 09/23/2020  Ost LAD lesion is 35% stenosed.  Prox LAD to Mid LAD lesion is 95% stenosed.  Lat 1st Diag lesion is 90% stenosed.  1st Diag-1 lesion is 90% stenosed.  1st Diag-2 lesion is 80% stenosed.  Prox Cx lesion is 75% stenosed.  1st Mrg lesion is 40% stenosed.  Mid Cx to Dist Cx lesion is 75% stenosed.  Prox RCA to Mid RCA lesion is 80% stenosed.  Dist RCA lesion is 50% stenosed.  RPDA lesion is 80% stenosed.  1st RPL lesion is 80% stenosed.  1.  Severe three-vessel coronary artery disease:  Severe stenosis of the mid LAD/first diagonal with complex calcific disease  Severe stenosis of the proximal and distal circumflex  Severe stenosis of the mid RCA and the PDA/PLA 2.  Hemodynamic findings consistent with probable moderate aortic stenosis, possibly with low flow component in the setting of severe LV dysfunction.  Peak to peak gradient 16 mmHg, mean gradient 9 mmHg.  Aortic valve crossed with a J-wire. 3.  Preserved cardiac output on  milrinone with cardiac index 5 L/min/m, low right heart filling pressures, LVEDP 19 mmHg. RA 2 RV 25/1 PA 29/10 mean 20 Pulmonary capillary wedge pressure 12 LVEDP 19 PA oxygen saturation 73% Ao oxygen saturation 97% Cardiac output 8.24 L/min Cardiac index 5.0 L/min/m Recommend: cardiac surgical evaluation for CABG/AVR. High risk candidate. Multidisciplinary heart team review.    Review of Systems  Constitutional: Positive for activity change and fatigue.  HENT: Positive for dental problem.        Has not seen a dentist in years  Eyes: Negative.   Respiratory: Positive for chest tightness and shortness of breath.        Orthopnea  Cardiovascular: Positive for leg swelling. Negative for chest pain.  Gastrointestinal: Negative.   Endocrine: Negative.   Genitourinary: Negative.   Musculoskeletal: Negative.   Allergic/Immunologic: Negative.   Neurological: Negative for dizziness and syncope.  Hematological: Negative.   Psychiatric/Behavioral: Negative.    Blood pressure 136/60, pulse (!) 111, temperature 98.6 F (37 C), temperature source Oral, resp. rate 14, height _0  (1.575 m), weight 62.6 kg, SpO2 100 %. Physical Exam Constitutional:      Appearance: Normal appearance. She is normal weight.     Comments: Looks older than stated age  HENT:     Head: Normocephalic and atraumatic.     Mouth/Throat:     Mouth: Mucous membranes are moist.     Comments: Multiple missing teeth Eyes:     Extraocular Movements: Extraocular movements intact.     Conjunctiva/sclera: Conjunctivae normal.     Pupils: Pupils are equal, round, and reactive to light.  Neck:     Vascular: No carotid bruit.  Cardiovascular:     Rate and Rhythm: Normal rate and  regular rhythm.     Pulses: Normal pulses.     Heart sounds: Murmur heard.      Comments: 3/6 systolic murmur along the right sternal border.  There is no diastolic murmur. Pulmonary:     Effort: Pulmonary effort is normal.     Breath sounds:  Normal breath sounds.  Abdominal:     General: Abdomen is flat. Bowel sounds are normal.     Palpations: Abdomen is soft.     Tenderness: There is no abdominal tenderness.  Musculoskeletal:        General: No swelling.     Cervical back: Normal range of motion and neck supple.  Skin:    General: Skin is warm and dry.  Neurological:     General: No focal deficit present.     Mental Status: She is alert and oriented to person, place, and time.  Psychiatric:        Mood and Affect: Mood normal.        Behavior: Behavior normal.    ECHOCARDIOGRAM REPORT       Patient Name:  Sara [name] Date of Exam: 09/21/2020  Medical Rec #: [id number]    Height:    62.0 in  Accession #:  [id number]   Weight:    143.2 lb  Date of Birth: 1950/09/04    BSA:     1.659 m  Patient Age:  54 years    BP:      132/68 mmHg  Patient Gender: F        HR:      96 bpm.  Exam Location: Inpatient   Procedure: 2D Echo, 3D Echo, Cardiac Doppler, Color Doppler and  Intracardiac       Opacification Agent   Indications:  Congestive Heart Failure I50.9    History:    Patient has prior history of Echocardiogram examinations,  most         recent 01/31/2020. CAD, Stroke; Risk Factors:Hypertension  and         Diabetes. Acute on chronic kidney disease. Elevated  troponins.    Sonographer:  Darlina Sicilian RDCS  Referring Phys: [place]    1. Left ventricular ejection fraction, by estimation, is 25 to 30%. The  left ventricle has severely decreased function. The left ventricle  demonstrates regional wall motion abnormalities (see scoring  diagram/findings for description). The left  ventricular internal cavity size was mildly dilated. There is mild left  ventricular hypertrophy. Left ventricular diastolic parameters are  consistent with Grade II diastolic dysfunction (pseudonormalization).  There is  severe akinesis of the left  ventricular, mid-apical anteroseptal wall and apical segment. There is  severe akinesis of the left ventricular, entire inferior wall.  2. Right ventricular systolic function is normal. The right ventricular  size is normal. There is mildly elevated pulmonary artery systolic  pressure.  3. Left atrial size was mildly dilated.  4. The mitral valve is grossly normal. Trivial mitral valve  regurgitation.  5. The aortic valve is calcified. There is moderate calcification of the  aortic valve. There is moderate thickening of the aortic valve. Aortic  valve regurgitation is trivial. Mild aortic valve stenosis.   FINDINGS  Left Ventricle: Left ventricular ejection fraction, by estimation, is 25  to 30%. The left ventricle has severely decreased function. The left  ventricle demonstrates regional wall motion abnormalities. Severe akinesis  of the left ventricular, mid-apical  anteroseptal wall and apical segment. Severe akinesis of the  left  ventricular, entire inferior wall. Definity contrast agent was given IV to  delineate the left ventricular endocardial borders. The left ventricular  internal cavity size was mildly dilated.  There is mild left ventricular hypertrophy. Left ventricular diastolic  parameters are consistent with Grade II diastolic dysfunction  (pseudonormalization).   Right Ventricle: The right ventricular size is normal. No increase in  right ventricular wall thickness. Right ventricular systolic function is  normal. There is mildly elevated pulmonary artery systolic pressure. The  tricuspid regurgitant velocity is 3.08  m/s, and with an assumed right atrial pressure of 3 mmHg, the estimated  right ventricular systolic pressure is 92.1 mmHg.   Left Atrium: Left atrial size was mildly dilated.   Right Atrium: Right atrial size was normal in size.   Pericardium: There is no evidence of pericardial effusion.   Mitral Valve: The  mitral valve is grossly normal. Trivial mitral valve  regurgitation.   Tricuspid Valve: The tricuspid valve is grossly normal. Tricuspid valve  regurgitation is mild.   Aortic Valve: The aortic valve is calcified. There is moderate  calcification of the aortic valve. There is moderate thickening of the  aortic valve. There is moderate aortic valve annular calcification. Aortic  valve regurgitation is trivial. Mild aortic  stenosis is present. Aortic valve mean gradient measures 11.8 mmHg. Aortic  valve peak gradient measures 20.6 mmHg. Aortic valve area, by VTI measures  0.89 cm.   Pulmonic Valve: The pulmonic valve was grossly normal. Pulmonic valve  regurgitation is not visualized.   Aorta: The aortic root and ascending aorta are structurally normal, with  no evidence of dilitation.   IAS/Shunts: The atrial septum is grossly normal.     LEFT VENTRICLE  PLAX 2D  LVIDd:     4.40 cm Diastology  LVIDs:     3.50 cm LV e' medial:  4.68 cm/s  LV PW:     1.20 cm LV E/e' medial: 23.3  LV IVS:    1.20 cm LV e' lateral:  4.79 cm/s  LVOT diam:   1.90 cm LV E/e' lateral: 22.8  LV SV:     41  LV SV Index:  25  LVOT Area:   2.84 cm               3D Volume EF:             3D EF:    44 %             LV EDV:    162 ml             LV ESV:    90 ml             LV SV:    71 ml   RIGHT VENTRICLE  RV S prime:   15.30 cm/s  TAPSE (M-mode): 2.6 cm   LEFT ATRIUM      Index    RIGHT ATRIUM      Index  LA diam:   3.50 cm 2.11 cm/m RA Area:   12.50 cm  LA Vol (A2C): 47.3 ml 28.52 ml/m RA Volume:  30.70 ml 18.51 ml/m  LA Vol (A4C): 57.4 ml 34.61 ml/m  AORTIC VALVE  AV Area (Vmax):  0.87 cm  AV Area (Vmean):  0.86 cm  AV Area (VTI):   0.89 cm  AV Vmax:      227.00 cm/s  AV Vmean:     158.500 cm/s  AV VTI:  0.468 m  AV  Peak Grad:   20.6 mmHg  AV Mean Grad:   11.8 mmHg  LVOT Vmax:     69.63 cm/s  LVOT Vmean:    48.100 cm/s  LVOT VTI:     0.146 m  LVOT/AV VTI ratio: 0.31    AORTA  Ao Root diam: 2.40 cm  Ao Asc diam: 2.90 cm   MITRAL VALVE        TRICUSPID VALVE  MV Area (PHT): 6.48 cm   TR Peak grad:  37.9 mmHg  MV Decel Time: 117 msec   TR Vmax:    308.00 cm/s  MV E velocity: 109.00 cm/s  MV A velocity: 118.00 cm/s SHUNTS  MV E/A ratio: 0.92     Systemic VTI: 0.15 m               Systemic Diam: 1.90 cm   Mertie Moores MD  Electronically signed by Mertie Moores MD  Signature Date/Time: 09/21/2020/11:41:48 AM     Physicians  Panel Physicians Referring Physician Case Authorizing Physician  Sherren Mocha, MD (Primary)      Procedures  RIGHT/LEFT HEART CATH AND CORONARY ANGIOGRAPHY   Conclusion    Ost LAD lesion is 35% stenosed.  Prox LAD to Mid LAD lesion is 95% stenosed.  Lat 1st Diag lesion is 90% stenosed.  1st Diag-1 lesion is 90% stenosed.  1st Diag-2 lesion is 80% stenosed.  Prox Cx lesion is 75% stenosed.  1st Mrg lesion is 40% stenosed.  Mid Cx to Dist Cx lesion is 75% stenosed.  Prox RCA to Mid RCA lesion is 80% stenosed.  Dist RCA lesion is 50% stenosed.  RPDA lesion is 80% stenosed.  1st RPL lesion is 80% stenosed.   1.  Severe three-vessel coronary artery disease:  Severe stenosis of the mid LAD/first diagonal with complex calcific disease  Severe stenosis of the proximal and distal circumflex  Severe stenosis of the mid RCA and the PDA/PLA  2.  Hemodynamic findings consistent with probable moderate aortic stenosis, possibly with low flow component in the setting of severe LV dysfunction.  Peak to peak gradient 16 mmHg, mean gradient 9 mmHg.  Aortic valve crossed with a J-wire.  3.  Preserved cardiac output on milrinone with cardiac index 5 L/min/m, low right heart filling pressures,  LVEDP 19 mmHg.  RA 2 RV 25/1 PA 29/10 mean 20 Pulmonary capillary wedge pressure 12 LVEDP 19  PA oxygen saturation 73% Ao oxygen saturation 97%  Cardiac output 8.24 L/min Cardiac index 5.0 L/min/m  Recommend: cardiac surgical evaluation for CABG/AVR. High risk candidate. Multidisciplinary heart team review.     Indications  Acute on chronic systolic heart failure (HCC) [I50.23 (ICD-10-CM)]   Procedural Details  Technical Details INDICATION: 70 year old woman with a complicated cardiovascular history who was found to have severe multivessel coronary artery disease and aortic stenosis last year.  Her previous evaluation was in [place], [place].  She presents now with decline in LV function and symptoms of congestive heart failure.  She is referred back for right and left heart catheterization for evaluation of treatment options regarding her coronary disease and aortic stenosis.  The patient has refractory heart failure and is on inotropic support with milrinone.  She has stage IV chronic kidney disease and creatinine this morning is 2.15 mg/dL, slightly worse than baseline which is ranged from 1.8-2.05.  Her diuretics have been held in anticipation of the procedure.  PROCEDURAL DETAILS: The right groin is prepped, draped, and  anesthetized with 1% lidocaine. Using direct ultrasound guidance a 5 French sheath is placed in the right femoral artery and a 7 French sheath is placed in the right femoral vein. [place] images are captured and stored in the patient's chart. A Swan-Ganz catheter is used for the right heart catheterization. Standard protocol is followed for recording of right heart pressures and sampling of oxygen saturations. Fick cardiac output is calculated. Standard Judkins catheters are used for selective coronary angiography. LV pressure is recorded and an aortic valve pullback gradient is recorded.  For venous hemostasis, a Mynx device was used for closure.  For  arterial hemostasis, a minx device is attempted but fails secondary to severe arterial calcification.  Manual pressure is used for arterial hemostasis.  There are no immediate procedural complications. The patient is transferred to the post catheterization recovery area for further monitoring.       Estimated blood loss <50 mL.   During this procedure medications were administered to achieve and maintain moderate conscious sedation while the patient's heart rate, blood pressure, and oxygen saturation were continuously monitored and I was present face-to-face 100% of this time.   Medications (Filter: Administrations occurring from 0859 to 1012 on 09/27/2020) (important) Continuous medications are totaled by the amount administered until 09/24/2020 1012.    Heparin (Porcine) in NaCl 1000-0.9 UT/500ML-% SOLN (mL) Total volume:  1,000 mL  Date/Time Rate/Dose/Volume Action   09/30/2020 0925 500 mL Given   0925 500 mL Given    fentaNYL (SUBLIMAZE) injection (mcg) Total dose:  75 mcg  Date/Time Rate/Dose/Volume Action   09/08/2020 0931 25 mcg Given   0947 25 mcg Given   0956 25 mcg Given    midazolam (VERSED) injection (mg) Total dose:  2 mg  Date/Time Rate/Dose/Volume Action   10/07/2020 0931 1 mg Given   0956 1 mg Given    lidocaine (PF) (XYLOCAINE) 1 % injection (mL) Total volume:  15 mL  Date/Time Rate/Dose/Volume Action   09/30/2020 0934 15 mL Given    iohexol (OMNIPAQUE) 350 MG/ML injection (mL) Total volume:  20 mL  Date/Time Rate/Dose/Volume Action   10/06/2020 1003 20 mL Given    0.9 % sodium chloride infusion (mL/hr) Total dose:  Cannot be calculated*  *Administration dose not documented Date/Time Rate/Dose/Volume Action   10/05/2020 0910 *Not included in total MAR Hold    acetaminophen (TYLENOL) tablet 650 mg (mg) Total dose:  Cannot be calculated*  *Administration dose not documented Date/Time Rate/Dose/Volume Action   09/15/2020 0910 *Not included in total MAR  Hold    aspirin EC tablet 81 mg (mg) Total dose:  Cannot be calculated*  *Administration dose not documented Date/Time Rate/Dose/Volume Action   09/24/2020 0910 *Not included in total MAR Hold    atorvastatin (LIPITOR) tablet 80 mg (mg) Total dose:  Cannot be calculated* Dosing weight:  65  *Administration dose not documented Date/Time Rate/Dose/Volume Action   09/23/2020 0910 *Not included in total MAR Hold    Chlorhexidine Gluconate Cloth 2 % PADS 6 each (each) Total dose:  Cannot be calculated* Dosing weight:  62.1  *Administration dose not documented Date/Time Rate/Dose/Volume Action   09/15/2020 0910 *Not included in total MAR Hold   1000 *Not included in total Automatically Held    ezetimibe (ZETIA) tablet 10 mg (mg) Total dose:  Cannot be calculated*  *Administration dose not documented Date/Time Rate/Dose/Volume Action   09/15/2020 0910 *Not included in total MAR Hold    feeding supplement (ENSURE ENLIVE / ENSURE PLUS) liquid 237 mL (  mL) Total dose:  Cannot be calculated* Dosing weight:  65  *Administration dose not documented Date/Time Rate/Dose/Volume Action   09/09/2020 0910 *Not included in total MAR Hold   1000 *Not included in total Automatically Held    hydrALAZINE (APRESOLINE) tablet 25 mg (mg) Total dose:  Cannot be calculated*  *Administration dose not documented Date/Time Rate/Dose/Volume Action   09/22/2020 0910 *Not included in total MAR Hold    insulin aspart (novoLOG) injection 0-15 Units (Units) Total dose:  Cannot be calculated*  *Administration dose not documented Date/Time Rate/Dose/Volume Action   09/17/2020 0910 *Not included in total MAR Hold    insulin aspart (novoLOG) injection 0-5 Units (Units) Total dose:  Cannot be calculated*  *Administration dose not documented Date/Time Rate/Dose/Volume Action   10/06/2020 0910 *Not included in total MAR Hold    insulin aspart (novoLOG) injection 3 Units (Units) Total dose:  Cannot be  calculated*  *Administration dose not documented Date/Time Rate/Dose/Volume Action   09/27/2020 0910 *Not included in total MAR Hold    insulin glargine (LANTUS) injection 12 Units (Units) Total dose:  Cannot be calculated* Dosing weight:  60.8  *Administration dose not documented Date/Time Rate/Dose/Volume Action   09/30/2020 0910 *Not included in total MAR Hold    isosorbide mononitrate (IMDUR) 24 hr tablet 30 mg (mg) Total dose:  Cannot be calculated*  *Administration dose not documented Date/Time Rate/Dose/Volume Action   10/06/2020 0910 *Not included in total MAR Hold    sodium chloride flush (NS) 0.9 % injection 10-40 mL (mL) Total dose:  Cannot be calculated* Dosing weight:  62.1  *Administration dose not documented Date/Time Rate/Dose/Volume Action   10/01/2020 0910 *Not included in total MAR Hold    sodium chloride flush (NS) 0.9 % injection 3 mL (mL) Total dose:  Cannot be calculated*  *Administration dose not documented Date/Time Rate/Dose/Volume Action   09/09/2020 0910 *Not included in total MAR Hold    sodium chloride flush (NS) 0.9 % injection 3 mL (mL) Total dose:  Cannot be calculated*  *Administration dose not documented Date/Time Rate/Dose/Volume Action   09/19/2020 0910 *Not included in total MAR Hold    sodium chloride flush (NS) 0.9 % injection 3 mL (mL) Total dose:  Cannot be calculated* Dosing weight:  60.8  *Administration dose not documented Date/Time Rate/Dose/Volume Action   10/05/2020 0910 *Not included in total MAR Hold    clopidogrel (PLAVIX) tablet 75 mg (mg) Total dose:  Cannot be calculated*  *Administration dose not documented Date/Time Rate/Dose/Volume Action   09/08/2020 0910 *Not included in total MAR Hold    Sedation Time  Sedation Time Physician-1: 28 minutes 23 seconds   Contrast  Medication Name Total Dose  iohexol (OMNIPAQUE) 350 MG/ML injection 20 mL    Radiation/Fluoro  Fluoro time: 2.9 (min) DAP: 6259  (mGycm2) Cumulative Air Kerma: 129 (mGy)   Coronary Findings   Diagnostic Dominance: Right  Left Main  Widely patent vessel. Bifurcates into the circumflex and LAD.  Left Anterior Descending  Ost LAD lesion is 35% stenosed.  Prox LAD to Mid LAD lesion is 95% stenosed. The lesion is calcified. The LAD just after the first diagonal branch has severe focal stenosis of 90 to 95%  First Diagonal Branch  1st Diag-1 lesion is 90% stenosed. The diagonal branch just proximal to the tight lesion in the LAD has 90% ostial stenosis. There is a tiny first diagonal, but the diagonal at the lesion site supplies significant myocardium. The vessel is diffusely diseased. There is severe disease in  the more distal branch and severe disease in the smaller more lateral branch.  1st Diag-2 lesion is 80% stenosed.  Lateral First Diagonal Branch  Lat 1st Diag lesion is 90% stenosed.  Left Circumflex  Prox Cx lesion is 75% stenosed. The proximal circumflex has a severe calcific 75% eccentric stenosis with an exophytic calcified lesion present.  Mid Cx to Dist Cx lesion is 75% stenosed. The lesion is moderately calcified. There is diffuse segmental plaquing throughout the distal circumflex extending into a posterolateral branch with at least 75% stenoses in this area.  First Obtuse Marginal Branch  1st Mrg lesion is 40% stenosed. The lesion is calcified.  Right Coronary Artery  Prox RCA to Mid RCA lesion is 80% stenosed. The lesion is calcified. There is diffuse calcific stenosis in the mid RCA with tandem 80% stenoses present.  Dist RCA lesion is 50% stenosed.  Right Posterior Descending Artery  RPDA lesion is 80% stenosed.  First Right Posterolateral Branch  1st RPL lesion is 80% stenosed.   Intervention   No interventions have been documented.  Coronary Diagrams   Diagnostic Dominance: Right    Intervention    Implants    Vascular Products   Closure Mynx Control 25f-- [id number]  Implanted  Inventory item: CLOSURE [facility] CenterCONTROL 73F Model/Cat number: [id number] Manufacturer: CSheridanLot number: [id number] Device identifier: [id number] identifier type: GS1   GUDID Information  Request status Successful    Brand name: MYNX CONTROL Version/Model: [id number] Company name: AWind Pointsafety info as of 09/22/2020: MR Safe  Contains dry or latex rubber: No    GMDN P.T. name: Wound hydrogel dressing, non-antimicrobial     As of 09/29/2020  Status: Implanted       Closure Mynx Control 568f [id number] Implanted  Inventory item: CLOSURE MY[facility] CenterONTROL 73F Model/Cat number: [id number]: COGridleyot number: [id number] identifier: [id number] identifier type: GS1   GUDID Information  Request status Successful    Brand name: MYNX CONTROL Version/Model: [id number] name: AcGasafety info as of 10/06/2020: MR Safe  Contains dry or latex rubber: No    GMDN P.T. name: Wound hydrogel dressing, non-antimicrobial     As of 09/10/2020  Status: Implanted       Syngo Images  Show images for CARDIAC CATHETERIZATION  Images on Long Term Storage  Show images for FrRitisha, Deitricko Procedure Log  Procedure Log     Hemo Data  Flowsheet Row Most Recent Value  Fick Cardiac Output 8.24 L/min  Fick Cardiac Output Index 5.03 (L/min)/BSA  Aortic Mean Gradient 9.15 mmHg  Aortic Peak Gradient 16 mmHg  Aortic Valve Area 2.79  Aortic Value Area Index 1.71 cm2/BSA  RA A Wave 2 mmHg  RA V Wave 2 mmHg  RA Mean 0 mmHg  RV Systolic Pressure 25 mmHg  RV Diastolic Pressure 0 mmHg  RV EDP 1 mmHg  PA Systolic Pressure 29 mmHg  PA Diastolic Pressure 10 mmHg  PA Mean 20 mmHg  PW A Wave 15 mmHg  PW V Wave 15 mmHg  PW Mean 12 mmHg  AO Systolic Pressure 10169mHg  AO Diastolic Pressure 50 mmHg  AO Mean 74 mmHg  LV Systolic Pressure 12450mHg  LV Diastolic Pressure 14 mmHg  LV EDP  21 mmHg  AOp Systolic Pressure 10388mHg  AOp Diastolic Pressure 48 mmHg  AOp Mean Pressure 69 mmHg  LVp Systolic Pressure 702 mmHg  LVp Diastolic Pressure 12 mmHg  LVp EDP Pressure 19 mmHg  QP/QS 1  TPVR Index 3.98 HRUI  TSVR Index 14.7 HRUI  TPVR/TSVR Ratio 0.27     Assessment/Plan:   This 70 year old woman has severe three-vessel coronary disease and moderate to severe aortic stenosis with an ejection fraction of 25 to 30% presenting with progressive exertional shortness of breath, fatigue, orthopnea and peripheral edema as well as a BNP of 1649 and a peak troponin of 2156  consistent with non-ST segment elevation MI and acute on chronic combined systolic and diastolic congestive heart failure.  Her coronary disease is most amenable to coronary bypass graft surgery.  Her aortic stenosis is at least moderate to severe and certainly could be contributing to her congestive heart failure.  I think the best option for treating this patient is coronary bypass graft surgery and aortic valve replacement.  She is at increased risk due to her chronic kidney disease, low ejection fraction, ongoing smoking with COPD, diabetes, and prior stroke.  She has multiple missing teeth and will need dental consultation preoperatively.  She may need extraction of her remaining teeth.  She has also been on Plavix chronically for history of stroke and this will need to be discontinued and washed out.  She has stage III-IV chronic kidney disease and we will need to wait till her creatinine has stabilized post catheterization.  I anticipate that she may be ready for coronary bypass and aortic valve replacement late next week. I discussed the operative procedure with the patient including alternatives, benefits and risks; including but not limited to bleeding, blood transfusion, infection, stroke, myocardial infarction, graft failure, heart block requiring a permanent pacemaker, organ dysfunction, and death.  Ronaldo Miyamoto  understands and agrees to proceed.     Gaye Pollack 09/14/2020, 11:43 AM

## 2020-09-25 NOTE — Interval H&P Note (Signed)
History and Physical Interval Note:  09/26/2020 9:29 AM  Sara [name]  has presented today for surgery, with the diagnosis of cad.  The various methods of treatment have been discussed with the patient and family. After consideration of risks, benefits and other options for treatment, the patient has consented to  Procedure(s): RIGHT/LEFT HEART CATH AND CORONARY ANGIOGRAPHY (N/A) as a surgical intervention.  The patient's history has been reviewed, patient examined, no change in status, stable for surgery.  I have reviewed the patient's chart and labs.  Questions were answered to the patient's satisfaction.     Sherren Mocha

## 2020-09-25 NOTE — Progress Notes (Addendum)
Progress Note  Patient Name: Sara [name] Date of Encounter: 09/27/2020  Primary Cardiologist: Previously followed by [place]   Having a lot of left shoulder pain this morning. Unclear etiology. She states this started after getting a second IV placed in her left upper arm. Creatinine worse today at 2.15>>from 1.85 yesterday  Inpatient Medications    Scheduled Meds: . aspirin EC  81 mg Oral Daily  . atorvastatin  80 mg Oral Daily  . Chlorhexidine Gluconate Cloth  6 each Topical Daily  . clopidogrel  75 mg Oral Daily  . ezetimibe  10 mg Oral Daily  . feeding supplement  237 mL Oral BID BM  . hydrALAZINE  25 mg Oral Q8H  . insulin aspart  0-15 Units Subcutaneous TID WC  . insulin aspart  0-5 Units Subcutaneous QHS  . insulin aspart  3 Units Subcutaneous TID WC  . insulin glargine  12 Units Subcutaneous Daily  . isosorbide mononitrate  30 mg Oral Daily  . sodium chloride flush  3 mL Intravenous Q12H  . sodium chloride flush  3 mL Intravenous Q12H   Continuous Infusions: . sodium chloride 10 mL/hr at 09/24/20 1928  . sodium chloride    . sodium chloride    . milrinone 0.25 mcg/kg/min (09/24/20 2118)   PRN Meds: sodium chloride, sodium chloride, acetaminophen, sodium chloride flush, sodium chloride flush, sodium chloride flush   Vital Signs    Vitals:   09/24/20 2100 09/18/2020 0055 09/29/2020 0359 09/26/2020 0419  BP: (!) 110/48 133/61 (!) 112/57   Pulse: (!) 54 100 (!) 104   Resp: 16 16    Temp: 98.9 F (37.2 C) 98 F (36.7 C) 98.6 F (37 C)   TempSrc: Oral Oral Oral   SpO2: 96% 100% 98%   Weight:    62.6 kg  Height:        Intake/Output Summary (Last 24 hours) at 09/08/2020 0733 Last data filed at 09/24/2020 2000 Gross per 24 hour  Intake 1202.5 ml  Output 750 ml  Net 452.5 ml   Filed Weights   09/24/20 0535 09/24/20 0636 09/13/2020 0419  Weight: 62.2 kg 60.8 kg 62.6 kg    Physical Exam   General: Having localized left shoulder  pain Neck: Negative for carotid bruits. No JVD Lungs:Clear to ausculation bilaterally. No wheezes, rales, or rhonchi. Breathing is unlabored. Cardiovascular: RRR with S1 S2. + murmur Abdomen: Soft, non-tender, non-distended. No obvious abdominal masses. Extremities: No edema. No clubbing or cyanosis. DP/PT pulses 2+ bilaterally Neuro: Alert and oriented Psych: Responds to questions appropriately with normal affect.    Labs    Chemistry Recent Labs  Lab 09/23/20 0313 09/24/20 0551 09/18/2020 0400  NA 138 138 133*  K 5.5* 4.2 4.7  CL 107 104 103  CO2 25 28 21*  GLUCOSE 228* 164* 404*  BUN 36* 33* 48*  CREATININE 2.04* 1.85* 2.15*  CALCIUM 8.5* 8.2* 8.0*  GFRNONAA 26* 29* 24*  ANIONGAP 6 6 9      Hematology Recent Labs  Lab 09/23/20 0313 09/24/20 0551 09/11/2020 0400  WBC 8.3 6.8 7.3  RBC 2.88* 2.67* 2.71*  HGB 8.5* 8.0* 8.1*  HCT 27.7* 25.0* 25.6*  MCV 96.2 93.6 94.5  MCH 29.5 30.0 29.9  MCHC 30.7 32.0 31.6  RDW 13.3 13.5 13.6  PLT 394 384 401*    Cardiac EnzymesNo results for input(s): TROPONINI in the last 168 hours. No results for input(s): TROPIPOC in the last 168 hours.  BNP Recent Labs  Lab 09/29/2020 0704  BNP 1,649.4*     DDimer No results for input(s): DDIMER in the last 168 hours.   Radiology    [place] EKG SITE RITE  Result Date: 09/23/2020 If Site Rite image not attached, placement could not be confirmed due to current cardiac rhythm.  Telemetry    10/02/2020 ST with episodes of ventricular bigeminy and PVCs  - Personally Reviewed  ECG    No new tracing as of 09/24/2020- Personally Reviewed  Cardiac Studies   Echocardiogram 01/26/2020 (Care Everywhere): 1. Mild LV dysfunction. EF is in the 40-45% range. The pattern is  worrisome for CAD.  2. Mild calcific aortic stenosis. Gradients are 20/11 mmHg.  3. Mild LVH _______________  Left Cardiac Catheterization 02/04/2020 (Care Everywhere): Mid LAD 70% stenosis, distal LCx 70% stenosis, mid RCA  80% stenosis, proximal RPDA 50% stenosis, left main unremarkable. _______________  Cardiac MRI 02/06/2020 (Care Everywhere): LVEF 51%, RVEF 67%, LV scar 4%, small subendocardial infarction of basal anterior/mid anterolateral wall (LAD or ramus), small subendocardial infarction of mid inferoseptum (RCA), dysfunctional myocardium in all 3 coronary artery distributions predominantly viable, mild-moderate AS (AVA 1.0cm2, peak gradient 18mmHg) Films in Epic now  Patient Profile     70 y.o. female history of 3 vessel CAD noted on cardiac catheterization in 01/2020, ischemic cardiomyopathy/chronic systolic CHF, stroke, hypertension, diabetes mellitusnow admitted for CHF and elevated troponin  Assessment & Plan    1. Acute on chronic CHF with ischemic cardiomyopathy: -Echo performed 09/24/20 with LVEF at 25-30% with mildly dilated ventricle, severely calcified aortic valve with AVA 0.87 cm2 and DVI 0.31 consistent with moderate to severe aortic stenosis>>she has known multi vessel CAD with prior plan for CABG while in [place] however this was never performed. She ultimately needs CABG with AVR versus PCI with TAVR with decision based on [facility] today  -Started on milrinone with last Co-OX at 66>>85 today -Started on hydralazine 25 TID and Imdur 30 QD -Unable to start ACE/ARN/ARNI due to renal dysfunction   2. Moderate to severe AS: -Echo as above with LVEF at 25-30% with mildly dilated ventricle, severely calcified aortic valve with AVA 0.87 cm2 and DVI 0.31 consistent with moderate to severe aortic stenosis>plan for [facility] today>>workup for CABG/AVR versus PCI with TAVR   3. NSTEMI with known multivessel CAD: -Found to have 3VD 01/2020>>s/p cMRI with viable myocardium with workup for CABG in [place] however never performed -HsT peak at 2156 this admission  -Continue ASA, Plavix, statin and Zetia  -Plan for [facility] today for possible intervention versus CABG  4. HTN: -Stable,  112/57>>133/61>>110/48 -Continue current regimen   5. HLD: -LDL noted to be 177 on labs this admission  -On PTA Pravastatin and Zetia>>transitioned to Atorvastatin 80 QD -Repeat panel in 6-8 weeks with LFT  6. Acute on chronic kidney disease stage IV: -Cr, 2.125 today>>up from 1.85 yesterday>>wiull notify lab team  -Unable to use ACEI/ARB/ARNI due to poor renal function  -Metolazone held yesterday due to rise in Cr -K+ elevated yesterday as well>>treated with Lokelma  7. Acute left shoulder pain: -Reports pain began after IV insertion this AM>>>no acute change on assessment   8. PVC/ventricular bigeminy: -Noted on telemetry>K+ 4.7>check Mg+ as add on to AM lab work   Signed, Kathyrn Drown NP-C [place] Pager: [phone] 09/25/2020, 7:33 AM     For questions or updates, please contact   Please consult [email or web address] for contact info under Cardiology/STEMI.  The patient was seen, examined and discussed with  Kathyrn Drown, NP  and I agree with the above.   The patient had central line placed yesterday with Coox  51->85%%, CVP 7, she was started on milrinone infusion 0.25 mcg/kg/min, I will decrease to 0.125 mcg/kg/min, she diuresed 2 L in last 24 hours with weight down 3 pounds since yesterday and improvement of creatinine from to 1.85, now up to 2.15. He was prepped for cardiac cath today as she has contrast allergy, despite creatinine increased we decided to proceed with low contrast diagnostic cath and right-sided cath, We will monitor closely for contrast nephropathy.  Ena Dawley, MD 10/06/2020

## 2020-09-26 DIAGNOSIS — N1832 Chronic kidney disease, stage 3b: Secondary | ICD-10-CM | POA: Diagnosis not present

## 2020-09-26 DIAGNOSIS — I10 Essential (primary) hypertension: Secondary | ICD-10-CM | POA: Diagnosis not present

## 2020-09-26 DIAGNOSIS — I5023 Acute on chronic systolic (congestive) heart failure: Secondary | ICD-10-CM | POA: Diagnosis not present

## 2020-09-26 DIAGNOSIS — E782 Mixed hyperlipidemia: Secondary | ICD-10-CM | POA: Diagnosis not present

## 2020-09-26 LAB — COOXEMETRY PANEL
Carboxyhemoglobin: 1.2 % (ref 0.5–1.5)
Carboxyhemoglobin: 1.3 % (ref 0.5–1.5)
Methemoglobin: 1.2 % (ref 0.0–1.5)
Methemoglobin: 0.8 % (ref 0.0–1.5)
O2 Saturation: 44.1 %
O2 Saturation: 57.8 %
Total hemoglobin: 7.9 g/dL — ABNORMAL LOW (ref 12.0–16.0)
Total hemoglobin: 7.9 g/dL — ABNORMAL LOW (ref 12.0–16.0)

## 2020-09-26 LAB — BASIC METABOLIC PANEL
Anion gap: 10 (ref 5–15)
BUN: 65 mg/dL — ABNORMAL HIGH (ref 8–23)
CO2: 21 mmol/L — ABNORMAL LOW (ref 22–32)
Calcium: 8.1 mg/dL — ABNORMAL LOW (ref 8.9–10.3)
Chloride: 102 mmol/L (ref 98–111)
Creatinine, Ser: 2.5 mg/dL — ABNORMAL HIGH (ref 0.44–1.00)
GFR, Estimated: 20 mL/min — ABNORMAL LOW (ref >60)
Glucose, Bld: 338 mg/dL — ABNORMAL HIGH (ref 70–99)
Potassium: 4.1 mmol/L (ref 3.5–5.1)
Sodium: 133 mmol/L — ABNORMAL LOW (ref 135–145)

## 2020-09-26 LAB — CBC
HCT: 23.4 % — ABNORMAL LOW (ref 36.0–46.0)
Hemoglobin: 7.6 g/dL — ABNORMAL LOW (ref 12.0–15.0)
MCH: 30 pg (ref 26.0–34.0)
MCHC: 32.5 g/dL (ref 30.0–36.0)
MCV: 92.5 fL (ref 80.0–100.0)
Platelets: 413 10*3/uL — ABNORMAL HIGH (ref 150–400)
RBC: 2.53 MIL/uL — ABNORMAL LOW (ref 3.87–5.11)
RDW: 14.1 % (ref 11.5–15.5)
WBC: 12.2 10*3/uL — ABNORMAL HIGH (ref 4.0–10.5)
nRBC: 0 % (ref 0.0–0.2)

## 2020-09-26 LAB — GLUCOSE, CAPILLARY
Glucose-Capillary: 296 mg/dL — ABNORMAL HIGH (ref 70–99)
Glucose-Capillary: 251 mg/dL — ABNORMAL HIGH (ref 70–99)
Glucose-Capillary: 242 mg/dL — ABNORMAL HIGH (ref 70–99)
Glucose-Capillary: 308 mg/dL — ABNORMAL HIGH (ref 70–99)

## 2020-09-26 MED ORDER — ALTEPLASE 2 MG IJ SOLR
2.0000 mg | Freq: Once | INTRAMUSCULAR | Status: AC
  Administered 2020-09-26: 2 mg
  Filled 2020-09-26: qty 2

## 2020-09-26 MED ORDER — FUROSEMIDE 10 MG/ML IJ SOLN
40.0000 mg | Freq: Two times a day (BID) | INTRAMUSCULAR | Status: DC
  Administered 2020-09-26 – 2020-09-29 (×7): 40 mg via INTRAVENOUS
  Filled 2020-09-26 (×7): qty 4

## 2020-09-26 MED ORDER — GLUCERNA SHAKE PO LIQD
237.0000 mL | Freq: Two times a day (BID) | ORAL | Status: DC
  Administered 2020-09-26 – 2020-09-30 (×7): 237 mL via ORAL

## 2020-09-26 MED ORDER — MILRINONE LACTATE IN DEXTROSE 20-5 MG/100ML-% IV SOLN
0.1250 ug/kg/min | INTRAVENOUS | Status: DC
Start: 2020-09-26 — End: 2020-10-02
  Administered 2020-09-27 – 2020-10-01 (×6): 0.25 ug/kg/min via INTRAVENOUS
  Filled 2020-09-26 (×6): qty 100

## 2020-09-26 NOTE — Progress Notes (Addendum)
Progress Note  Patient Name: Sara [name] Date of Encounter: 09/26/2020  Primary Cardiologist: New patient/previously followed by [facility]   The patient is feeling more short of breath today, O2 sats down to 88% requiring 3 L of oxygen via nasal cannula.  Inpatient Medications    Scheduled Meds: . aspirin EC  81 mg Oral Daily  . atorvastatin  80 mg Oral Daily  . Chlorhexidine Gluconate Cloth  6 each Topical Daily  . ezetimibe  10 mg Oral Daily  . feeding supplement  237 mL Oral BID BM  . furosemide  40 mg Intravenous BID  . hydrALAZINE  25 mg Oral Q8H  . insulin aspart  0-15 Units Subcutaneous TID WC  . insulin aspart  0-5 Units Subcutaneous QHS  . insulin aspart  3 Units Subcutaneous TID WC  . insulin glargine  12 Units Subcutaneous Daily  . isosorbide mononitrate  30 mg Oral Daily  . sodium chloride flush  3 mL Intravenous Q12H  . sodium chloride flush  3 mL Intravenous Q12H  . sodium chloride flush  3 mL Intravenous Q12H   Continuous Infusions: . sodium chloride 10 mL/hr at 09/24/20 1928  . sodium chloride    . milrinone     PRN Meds: sodium chloride, sodium chloride, acetaminophen, ondansetron (ZOFRAN) IV, sodium chloride flush, sodium chloride flush, sodium chloride flush   Vital Signs    Vitals:   10/05/2020 1258 09/13/2020 2057 09/26/20 0500 09/26/20 0520  BP: 129/76 112/84 118/64   Pulse: (!) 109 (!) 105 (!) 105 (!) 102  Resp:  16 16   Temp:  98.3 F (36.8 C) 97.7 F (36.5 C)   TempSrc:  Oral Oral   SpO2: 95% 99% 94% (!) 87%  Weight:   62.9 kg   Height:        Intake/Output Summary (Last 24 hours) at 09/26/2020 1107 Last data filed at 09/17/2020 2100 Gross per 24 hour  Intake 240 ml  Output --  Net 240 ml   Filed Weights   09/24/20 0636 09/10/2020 0419 09/26/20 0500  Weight: 60.8 kg 62.6 kg 62.9 kg    Physical Exam   General: Having localized left shoulder pain Neck: Negative for carotid bruits. No JVD Lungs:Clear to  ausculation bilaterally. No wheezes, rales, or rhonchi. Breathing is unlabored. Cardiovascular: RRR with S1 S2. + murmur Abdomen: Soft, non-tender, non-distended. No obvious abdominal masses. Extremities: No edema. No clubbing or cyanosis. DP/PT pulses 2+ bilaterally Neuro: Alert and oriented Psych: Responds to questions appropriately with normal affect.    Labs    Chemistry Recent Labs  Lab 09/24/20 0551 09/18/2020 0400 09/10/2020 0948 09/26/20 0500  NA 138 133* 137 133*  K 4.2 4.7 3.9 4.1  CL 104 103  --  102  CO2 28 21*  --  21*  GLUCOSE 164* 404*  --  338*  BUN 33* 48*  --  65*  CREATININE 1.85* 2.15*  --  2.50*  CALCIUM 8.2* 8.0*  --  8.1*  GFRNONAA 29* 24*  --  20*  ANIONGAP 6 9  --  10     Hematology Recent Labs  Lab 09/24/20 0551 09/16/2020 0400 09/28/2020 0948 09/26/20 0500  WBC 6.8 7.3  --  12.2*  RBC 2.67* 2.71*  --  2.53*  HGB 8.0* 8.1* 7.8* 7.6*  HCT 25.0* 25.6* 23.0* 23.4*  MCV 93.6 94.5  --  92.5  MCH 30.0 29.9  --  30.0  MCHC 32.0  31.6  --  32.5  RDW 13.5 13.6  --  14.1  PLT 384 401*  --  413*    Cardiac EnzymesNo results for input(s): TROPONINI in the last 168 hours. No results for input(s): TROPIPOC in the last 168 hours.   BNP Recent Labs  Lab 09/30/2020 0704  BNP 1,649.4*     DDimer No results for input(s): DDIMER in the last 168 hours.   Radiology    DG Orthopantogram  Result Date: 10/02/2020 CLINICAL DATA:  Preop evaluation.  Aortic stenosis EXAM: ORTHOPANTOGRAM/PANORAMIC COMPARISON: None. FINDINGS: Only 6 mandibular teeth remain. Dental caries noted in the remaining incisors and right lower molar. No evidence of periapical abscess. IMPRESSION: Dental caries in the remaining teeth in the mandible.No periapical abscess. Electronically Signed   By: Rolm Baptise M.D.   On: 10/02/2020 20:38   CARDIAC CATHETERIZATION  Result Date: 09/30/2020  Ost LAD lesion is 35% stenosed.  Prox LAD to Mid LAD lesion is 95% stenosed.  Lat 1st Diag lesion is  90% stenosed.  1st Diag-1 lesion is 90% stenosed.  1st Diag-2 lesion is 80% stenosed.  Prox Cx lesion is 75% stenosed.  1st Mrg lesion is 40% stenosed.  Mid Cx to Dist Cx lesion is 75% stenosed.  Prox RCA to Mid RCA lesion is 80% stenosed.  Dist RCA lesion is 50% stenosed.  RPDA lesion is 80% stenosed.  1st RPL lesion is 80% stenosed.  1.  Severe three-vessel coronary artery disease:  Severe stenosis of the mid LAD/first diagonal with complex calcific disease  Severe stenosis of the proximal and distal circumflex  Severe stenosis of the mid RCA and the PDA/PLA 2.  Hemodynamic findings consistent with probable moderate aortic stenosis, possibly with low flow component in the setting of severe LV dysfunction.  Peak to peak gradient 16 mmHg, mean gradient 9 mmHg.  Aortic valve crossed with a J-wire. 3.  Preserved cardiac output on milrinone with cardiac index 5 L/min/m, low right heart filling pressures, LVEDP 19 mmHg. RA 2 RV 25/1 PA 29/10 mean 20 Pulmonary capillary wedge pressure 12 LVEDP 19 PA oxygen saturation 73% Ao oxygen saturation 97% Cardiac output 8.24 L/min Cardiac index 5.0 L/min/m Recommend: cardiac surgical evaluation for CABG/AVR. High risk candidate. Multidisciplinary heart team review.   Telemetry    Sinus tachycardia with frequent PVCs- Personally Reviewed  ECG    No new tracing as of 09/14/2020- Personally Reviewed  Cardiac Studies   Echocardiogram 01/26/2020 (Care Everywhere): 1. Mild LV dysfunction. EF is in the 40-45% range. The pattern is  worrisome for CAD.  2. Mild calcific aortic stenosis. Gradients are 20/11 mmHg.  3. Mild LVH _______________  Left Cardiac Catheterization 02/04/2020 (Care Everywhere): Mid LAD 70% stenosis, distal LCx 70% stenosis, mid RCA 80% stenosis, proximal RPDA 50% stenosis, left main unremarkable. _______________  Cardiac MRI 02/06/2020 (Care Everywhere): LVEF 51%, RVEF 67%, LV scar 4%, small subendocardial infarction of basal  anterior/mid anterolateral wall (LAD or ramus), small subendocardial infarction of mid inferoseptum (RCA), dysfunctional myocardium in all 3 coronary artery distributions predominantly viable, mild-moderate AS (AVA 1.0cm2, peak gradient 62mmHg) Films in Epic now  Patient Profile     70 y.o. female history of 3 vessel CAD noted on cardiac catheterization in 01/2020, ischemic cardiomyopathy/chronic systolic CHF, stroke, hypertension, diabetes mellitusnow admitted for CHF and elevated troponin  Assessment & Plan    1. Acute on chronic CHF with ischemic cardiomyopathy: -Echo performed 09/24/20 with LVEF at 25-30% with mildly dilated ventricle, severely calcified aortic  valve with AVA 0.87 cm2 and DVI 0.31 consistent with moderate to severe aortic stenosis>>she has known multi vessel CAD with prior plan for CABG while in [place] however this was never performed.  Cardiac cath yesterday showed severe CAD and at least moderate aortic stenosis, she ultimately needs CABG with AVR, she was evaluated by Dr. Mohammed Kindle yesterday and needs teeth extraction prior to potential surgery. -Started on milrinone with last Co-OX at 66>>85 >> 44 today, I will increase milrinone dose 0.25 -She has worsening kidney failure secondary to underlying kidney failure, poor perfusion from low EF and contrast nephropathy from yesterday cath -Her weight is up, I will give Lasix 40 mg IV twice daily -Started on hydralazine 25 TID and Imdur 30 QD -Unable to start ACE/ARN/ARNI due to renal dysfunction   2. Moderate to severe AS: -Echo as above with LVEF at 25-30% with mildly dilated ventricle, severely calcified aortic valve with AVA 0.87 cm2 and DVI 0.31 consistent with moderate to severe aortic stenosis>plan for [facility] today>>workup for CABG/AVR versus PCI with TAVR   3. NSTEMI with known multivessel CAD: -Found to have 3VD 01/2020>>s/p cMRI with viable myocardium with workup for CABG in [place] however never performed -HsT  peak at 2156 this admission  -Continue ASA, Plavix, statin and Zetia  -Plan for [facility] today for possible intervention versus CABG  4. HTN: -Stable, 112/57>>133/61>>110/48 -Continue current regimen   5. HLD: -LDL noted to be 177 on labs this admission  -On PTA Pravastatin and Zetia>>transitioned to Atorvastatin 80 QD -Repeat panel in 6-8 weeks with LFT  6. Acute on chronic kidney disease stage IV: -Cr, 2.125 today>>up from 1.85 yesterday>>wiull notify lab team  -Unable to use ACEI/ARB/ARNI due to poor renal function  -Metolazone held yesterday due to rise in Cr -K+ elevated yesterday as well>>treated with Lokelma -Anemia hemoglobin today 7.6, if further drop, I will consider 1 unit of packed red blood cells.  7. Acute left shoulder pain: -Reports pain began after IV insertion this AM>>>no acute change on assessment   8. PVC/ventricular bigeminy: -Noted on telemetry>K+ 4.7>check Mg+ as add on to AM lab work   Ena Dawley, MD 09/26/2020

## 2020-09-26 NOTE — Progress Notes (Addendum)
CARDIAC REHAB PHASE I   PRE:  Rate/Rhythm: 104 ST with Multiform PVC's  BP:  Supine:   Sitting: 123/59     SaO2: 95% 2L Continuous  MODE:  Ambulation: 120 ft   POST:  Rate/Rhythm: 129 ST with PVC's  BP:  Supine:   Sitting: 136/84    SaO2: 97% 2L continuous  Pt was seated in chair finishing breakfast. Pt seemed very hesitant to walk due to not feeling well at all yesterday and finally feeling better today. She stated she did not want to overdo it and go back to the way she was yesterday. I stated I was there just to get her up and moving and it was totally up to her how far we ambulated. She agreed to walking in hall. Pt was independent with getting out of bed and with ambulation. I pushed IV pole and oxygen tank. Pt is on 2L of continuous oxygen. She was only able to ambulate 156ft on 2L, she complained of mild shortness of breath with difficulty. No CP or any other complaints. Returned pt seated in chair. Sats maintained in the 90s during ambulation. Once seated in chair Sats were 97% on 2L. She is awaiting scheduling for CABG. Will continue to follow.  [place], MS, ACSM-CEP

## 2020-09-26 NOTE — Progress Notes (Signed)
   Subjective:  Patient or she has some shortness of breath overnight .  She is sitting up in chair says she feels better in this position.  Objective:  Vital signs in last 24 hours: Vitals:   10/07/2020 1258 10/06/2020 2057 09/26/20 0500 09/26/20 0520  BP: 129/76 112/84 118/64   Pulse: (!) 109 (!) 105 (!) 105 (!) 102  Resp:  16 16   Temp:  98.3 F (36.8 C) 97.7 F (36.5 C)   TempSrc:  Oral Oral   SpO2: 95% 99% 94% (!) 87%  Weight:      Height:       General: No acute distress, sitting in bedside chair wearing nasal cannula CV: Regular rate and rhythm, 3/6 systolic murmur.  No JVD Pulmonary: Bibasilar rales up to mid lung fields.  No increased work of breathing Extremity: Lower extremity edema, warm extremities  Assessment/Plan:  Principal Problem:   Acute on chronic HFrEF (heart failure with reduced ejection fraction) (HCC) Active Problems:   NSTEMI (non-ST elevated myocardial infarction) ([place])   Diabetes mellitus ([place])   Essential hypertension   Hyperlipidemia   CKD (chronic kidney disease), stage III ([place])  70 year old person with ischemic heart disease, T2DM, HTN, HLD, and CKD stage IIIb admitted for management of acute on chronic HFrEF, now s/p cardiac catheterization and awaiting inpatient CABG.  Acute on chronic systolic heart failure  Moderate to Severe AS Type 2 NSTEMI with know CAD -Patient was admitted with acute on chronic heart failure exacerbation.  No active chest pain but patients troponin peaked to 2156 Patient undergoing work-up for planned CABG and aortic valve replacement next week, CT surgeon, Dr. Cyndia Bent .  Reports her x-rays were done for her dental extractions yesterday. -Cardiology also following and coox was down 85-44 today, appreciate milrinone dose was increased to 0.25.  Bibasilar rales and increased shortness of breath this morning.  Patient atient was dosed Lasix 40 mg twice daily today -Continue hydralazine 25 3 times daily and Imdur 30 daily.   -Currently on aspirin ,Zetia -Pravastatin stopped and atorvastatin 80 mg started this admission -Plavix has been discontinued in anticipation of surgery -Appreciate cardiology recommendations  Anemia Hemoglobin stable today 7.6   Acute on CKD stage IIIb Baseline creatinine 1.5, trend 1.85> 2.15> 2.5.  If this was from contrast-induced I expect it to show up 48 hours after exposure.  This might suggest worsening perfusion to kidneys and milrinone dose was increased today. - Trend BMP  Prior to Admission Living Arrangement: Home (in [place], [place]) Anticipated Discharge Location: TBD Barriers to Discharge: continued medical management Dispo: Anticipated discharge after CABG and AVR.   Madalyn Rob, MD 09/26/2020, 6:54 AM Pager: [phone] After 5pm on weekdays and 1pm on weekends: On Call pager [phone]

## 2020-09-27 DIAGNOSIS — E119 Type 2 diabetes mellitus without complications: Secondary | ICD-10-CM | POA: Diagnosis not present

## 2020-09-27 DIAGNOSIS — I214 Non-ST elevation (NSTEMI) myocardial infarction: Secondary | ICD-10-CM | POA: Diagnosis not present

## 2020-09-27 DIAGNOSIS — I10 Essential (primary) hypertension: Secondary | ICD-10-CM | POA: Diagnosis not present

## 2020-09-27 DIAGNOSIS — I5023 Acute on chronic systolic (congestive) heart failure: Secondary | ICD-10-CM | POA: Diagnosis not present

## 2020-09-27 DIAGNOSIS — I350 Nonrheumatic aortic (valve) stenosis: Secondary | ICD-10-CM

## 2020-09-27 DIAGNOSIS — N1832 Chronic kidney disease, stage 3b: Secondary | ICD-10-CM | POA: Diagnosis not present

## 2020-09-27 DIAGNOSIS — I25110 Atherosclerotic heart disease of native coronary artery with unstable angina pectoris: Secondary | ICD-10-CM | POA: Diagnosis not present

## 2020-09-27 DIAGNOSIS — I2510 Atherosclerotic heart disease of native coronary artery without angina pectoris: Secondary | ICD-10-CM

## 2020-09-27 LAB — CBC
HCT: 23.5 % — ABNORMAL LOW (ref 36.0–46.0)
Hemoglobin: 7.4 g/dL — ABNORMAL LOW (ref 12.0–15.0)
MCH: 29.2 pg (ref 26.0–34.0)
MCHC: 31.5 g/dL (ref 30.0–36.0)
MCV: 92.9 fL (ref 80.0–100.0)
Platelets: 381 10*3/uL (ref 150–400)
RBC: 2.53 MIL/uL — ABNORMAL LOW (ref 3.87–5.11)
RDW: 14.3 % (ref 11.5–15.5)
WBC: 9.3 10*3/uL (ref 4.0–10.5)
nRBC: 0 % (ref 0.0–0.2)

## 2020-09-27 LAB — BASIC METABOLIC PANEL
Anion gap: 7 (ref 5–15)
BUN: 59 mg/dL — ABNORMAL HIGH (ref 8–23)
CO2: 22 mmol/L (ref 22–32)
Calcium: 7.7 mg/dL — ABNORMAL LOW (ref 8.9–10.3)
Chloride: 105 mmol/L (ref 98–111)
Creatinine, Ser: 2.23 mg/dL — ABNORMAL HIGH (ref 0.44–1.00)
GFR, Estimated: 23 mL/min — ABNORMAL LOW (ref >60)
Glucose, Bld: 257 mg/dL — ABNORMAL HIGH (ref 70–99)
Potassium: 3.7 mmol/L (ref 3.5–5.1)
Sodium: 134 mmol/L — ABNORMAL LOW (ref 135–145)

## 2020-09-27 LAB — GLUCOSE, CAPILLARY
Glucose-Capillary: 246 mg/dL — ABNORMAL HIGH (ref 70–99)
Glucose-Capillary: 191 mg/dL — ABNORMAL HIGH (ref 70–99)
Glucose-Capillary: 256 mg/dL — ABNORMAL HIGH (ref 70–99)
Glucose-Capillary: 144 mg/dL — ABNORMAL HIGH (ref 70–99)

## 2020-09-27 LAB — COOXEMETRY PANEL
Carboxyhemoglobin: 1.5 % (ref 0.5–1.5)
Methemoglobin: 0.9 % (ref 0.0–1.5)
O2 Saturation: 73.8 %
Total hemoglobin: 8.1 g/dL — ABNORMAL LOW (ref 12.0–16.0)

## 2020-09-27 MED ORDER — ENOXAPARIN SODIUM 30 MG/0.3ML ~~LOC~~ SOLN
30.0000 mg | SUBCUTANEOUS | Status: DC
  Administered 2020-09-27 – 2020-10-03 (×7): 30 mg via SUBCUTANEOUS
  Filled 2020-09-27 (×7): qty 0.3

## 2020-09-27 NOTE — Progress Notes (Signed)
   Subjective:  No acute events reported overnight.  This morning, patient is resting comfortably in bed and reports shortness of breath is better.  She does feel tired this morning and reports she has not eaten breakfast yet. Her daughter was able to come by and braid her hair yesterday.  No acute concerns she needs [place] to address today.  utObjective:  Vital signs in last 24 hours: Vitals:   09/26/20 2038 09/26/20 2100 09/26/20 2300 09/27/20 0437  BP: (!) 120/54 (!) 120/54 (!) 132/58 125/68  Pulse: (!) 111 (!) 110 (!) 110 (!) 110  Resp: 17 18 18 18   Temp: 98 F (36.7 C) 98 F (36.7 C) 98.3 F (36.8 C) 98 F (36.7 C)  TempSrc: Oral Oral Oral Oral  SpO2: 100% 100% 100% 99%  Weight:      Height:       General: No acute distress, elderly woman sitting in her bed, very pleasant, well kept CV: Regular rate and rhythm, 3/6 systolic murmur, no JVD Lungs: Normal work of breathing, rales at bilateral bases  (improved since yesterday) Extremity: No lower extremity edema, warm  Assessment/Plan:  Principal Problem:   Acute on chronic HFrEF (heart failure with reduced ejection fraction) (HCC) Active Problems:   NSTEMI (non-ST elevated myocardial infarction) ([place])   Diabetes mellitus ([place])   Essential hypertension   Hyperlipidemia   CKD (chronic kidney disease), stage III ([place])  70 year old person with ischemic heart disease, T2DM, HTN, HLD, and CKD stage IIIb admitted for management of acute on chronic HFrEF, now s/p cardiac catheterization and awaiting inpatient CABG.  Acute on chronic systolic heart failure  Moderate to Severe AS Type 2 NSTEMI with know CAD -Patient was admitted with acute on chronic heart failure exacerbation.  No active chest pain but patients troponin peaked to 2156.Patient undergoing work-up for planned CABG and aortic valve replacement next week, CT surgeon, Dr. Cyndia Bent .Reports her x-rays were done for her dental extractions and she has an infection under one  tooth. Reports it will be extracted. -Cardiology also following and coox was down 85-44 , improved with increasing milrinone to 0.25, today 74   -Patient is now on Lasix 40 mg twice daily.  Roughly 1500 ml out daily the last two days with net 1L daily, waiting on todays weight to correlate.  -Continue hydralazine 25 3 times daily and Imdur 30 daily.  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  Milrinone increase to 0.25 , and related improvement in creatinine.   - Trend BMP  Prior to Admission Living Arrangement: Home (in [place], [place]) Anticipated Discharge Location: TBD Barriers to Discharge: continued medical management Dispo: Anticipated discharge after CABG and AVR.   Madalyn Rob, MD 09/27/2020, 7:07 AM Pager: [phone] After 5pm on weekdays and 1pm on weekends: On Call pager [phone]

## 2020-09-27 NOTE — Progress Notes (Addendum)
Progress Note  Patient Name: Baillie Mohammad Date of Encounter: 09/27/2020  Primary Cardiologist: New patient/previously followed by [place]   The patient is feeling better today, less tired and improved shortness of breath.  No chest pain.  Inpatient Medications    Scheduled Meds: . aspirin EC  81 mg Oral Daily  . atorvastatin  80 mg Oral Daily  . Chlorhexidine Gluconate Cloth  6 each Topical Daily  . ezetimibe  10 mg Oral Daily  . feeding supplement (GLUCERNA SHAKE)  237 mL Oral BID BM  . furosemide  40 mg Intravenous BID  . hydrALAZINE  25 mg Oral Q8H  . insulin aspart  0-15 Units Subcutaneous TID WC  . insulin aspart  0-5 Units Subcutaneous QHS  . insulin aspart  3 Units Subcutaneous TID WC  . insulin glargine  12 Units Subcutaneous Daily  . isosorbide mononitrate  30 mg Oral Daily  . sodium chloride flush  3 mL Intravenous Q12H  . sodium chloride flush  3 mL Intravenous Q12H  . sodium chloride flush  3 mL Intravenous Q12H   Continuous Infusions: . sodium chloride 250 mL (09/26/20 1127)  . sodium chloride    . milrinone 0.25 mcg/kg/min (09/27/20 0548)   PRN Meds: sodium chloride, sodium chloride, acetaminophen, ondansetron (ZOFRAN) IV, sodium chloride flush, sodium chloride flush, sodium chloride flush   Vital Signs    Vitals:   09/26/20 2038 09/26/20 2100 09/26/20 2300 09/27/20 0437  BP: (!) 120/54 (!) 120/54 (!) 132/58 125/68  Pulse: (!) 111 (!) 110 (!) 110 (!) 110  Resp: 17 18 18 18   Temp: 98 F (36.7 C) 98 F (36.7 C) 98.3 F (36.8 C) 98 F (36.7 C)  TempSrc: Oral Oral Oral Oral  SpO2: 100% 100% 100% 99%  Weight:      Height:        Intake/Output Summary (Last 24 hours) at 09/27/2020 0903 Last data filed at 09/27/2020 0600 Gross per 24 hour  Intake 741.81 ml  Output 2850 ml  Net -2108.19 ml   Filed Weights   09/24/20 0636 09/15/2020 0419 09/26/20 0500  Weight: 60.8 kg 62.6 kg 62.9 kg    Physical Exam   General: Not in  acute distress sitting in a chair Neck: Negative for carotid bruits. No JVD Lungs: Mild crackles at lung bases  cardiovascular: RRR with S1 S2.  4 out of 6 holosystolic murmur Abdomen: Soft, non-tender, non-distended. No obvious abdominal masses. Extremities: No edema. No clubbing or cyanosis. DP/PT pulses 2+ bilaterally Neuro: Alert and oriented Psych: Responds to questions appropriately with normal affect.    Labs    Chemistry Recent Labs  Lab 09/10/2020 0400 09/19/2020 0948 09/26/20 0500 09/27/20 0617  NA 133* 137 133* 134*  K 4.7 3.9 4.1 3.7  CL 103  --  102 105  CO2 21*  --  21* 22  GLUCOSE 404*  --  338* 257*  BUN 48*  --  65* 59*  CREATININE 2.15*  --  2.50* 2.23*  CALCIUM 8.0*  --  8.1* 7.7*  GFRNONAA 24*  --  20* 23*  ANIONGAP 9  --  10 7     Hematology Recent Labs  Lab 09/21/2020 0400 09/14/2020 0948 09/26/20 0500 09/27/20 0617  WBC 7.3  --  12.2* 9.3  RBC 2.71*  --  2.53* 2.53*  HGB 8.1* 7.8* 7.6* 7.4*  HCT 25.6* 23.0* 23.4* 23.5*  MCV 94.5  --  92.5 92.9  MCH  29.9  --  30.0 29.2  MCHC 31.6  --  32.5 31.5  RDW 13.6  --  14.1 14.3  PLT 401*  --  413* 381    Cardiac EnzymesNo results for input(s): TROPONINI in the last 168 hours. No results for input(s): TROPIPOC in the last 168 hours.   BNP No results for input(s): BNP, PROBNP in the last 168 hours.   DDimer No results for input(s): DDIMER in the last 168 hours.   Radiology    DG Orthopantogram  Result Date: 10/02/2020 CLINICAL DATA:  Preop evaluation.  Aortic stenosis EXAM: ORTHOPANTOGRAM/PANORAMIC COMPARISON: None. FINDINGS: Only 6 mandibular teeth remain. Dental caries noted in the remaining incisors and right lower molar. No evidence of periapical abscess. IMPRESSION: Dental caries in the remaining teeth in the mandible.No periapical abscess. Electronically Signed   By: Rolm Baptise M.D.   On: 09/28/2020 20:38   CARDIAC CATHETERIZATION  Result Date: 09/30/2020  Ost LAD lesion is 35% stenosed.   Prox LAD to Mid LAD lesion is 95% stenosed.  Lat 1st Diag lesion is 90% stenosed.  1st Diag-1 lesion is 90% stenosed.  1st Diag-2 lesion is 80% stenosed.  Prox Cx lesion is 75% stenosed.  1st Mrg lesion is 40% stenosed.  Mid Cx to Dist Cx lesion is 75% stenosed.  Prox RCA to Mid RCA lesion is 80% stenosed.  Dist RCA lesion is 50% stenosed.  RPDA lesion is 80% stenosed.  1st RPL lesion is 80% stenosed.  1.  Severe three-vessel coronary artery disease:  Severe stenosis of the mid LAD/first diagonal with complex calcific disease  Severe stenosis of the proximal and distal circumflex  Severe stenosis of the mid RCA and the PDA/PLA 2.  Hemodynamic findings consistent with probable moderate aortic stenosis, possibly with low flow component in the setting of severe LV dysfunction.  Peak to peak gradient 16 mmHg, mean gradient 9 mmHg.  Aortic valve crossed with a J-wire. 3.  Preserved cardiac output on milrinone with cardiac index 5 L/min/m, low right heart filling pressures, LVEDP 19 mmHg. RA 2 RV 25/1 PA 29/10 mean 20 Pulmonary capillary wedge pressure 12 LVEDP 19 PA oxygen saturation 73% Ao oxygen saturation 97% Cardiac output 8.24 L/min Cardiac index 5.0 L/min/m Recommend: cardiac surgical evaluation for CABG/AVR. High risk candidate. Multidisciplinary heart team review.   Telemetry    Sinus tachycardia with frequent PVCs- Personally Reviewed  ECG    No new tracing as of 09/21/2020- Personally Reviewed  Cardiac Studies   Echocardiogram 01/26/2020 (Care Everywhere): 1. Mild LV dysfunction. EF is in the 40-45% range. The pattern is  worrisome for CAD.  2. Mild calcific aortic stenosis. Gradients are 20/11 mmHg.  3. Mild LVH _______________  Left Cardiac Catheterization 02/04/2020 (Care Everywhere): Mid LAD 70% stenosis, distal LCx 70% stenosis, mid RCA 80% stenosis, proximal RPDA 50% stenosis, left main unremarkable. _______________  Cardiac MRI 02/06/2020 (Care Everywhere): LVEF  51%, RVEF 67%, LV scar 4%, small subendocardial infarction of basal anterior/mid anterolateral wall (LAD or ramus), small subendocardial infarction of mid inferoseptum (RCA), dysfunctional myocardium in all 3 coronary artery distributions predominantly viable, mild-moderate AS (AVA 1.0cm2, peak gradient 43mmHg) Films in Epic now  Patient Profile     70 y.o. female history of 3 vessel CAD noted on cardiac catheterization in 01/2020, ischemic cardiomyopathy/chronic systolic CHF, stroke, hypertension, diabetes mellitusnow admitted for CHF and elevated troponin  Assessment & Plan    1. Acute on chronic CHF with ischemic cardiomyopathy: -Echo performed 09/24/20 with LVEF  at 25-30% with mildly dilated ventricle, severely calcified aortic valve with AVA 0.87 cm2 and DVI 0.31 consistent with moderate to severe aortic stenosis>>she has known multi vessel CAD with prior plan for CABG while in [place] however this was never performed.  Cardiac cath on 10/07/2020 showed severe CAD and at least moderate aortic stenosis, she ultimately needs CABG with AVR, she was evaluated by Dr. Mohammed Kindle yesterday and needs teeth extraction prior to potential surgery. -Started on milrinone with last Co-OX at 66>>85 >> 44 ->74 today, improved with increased dose of milrinone dose 0.25, she did not tolerate lower dose. -She had worsening kidney failure secondary to underlying kidney failure, poor perfusion from low EF and contrast nephropathy from yesterday cath, slightly improved today from 2.5-2.2 -Her weight is up, I will give Lasix 40 mg IV twice daily -Started on hydralazine 25 TID and Imdur 30 QD -Unable to start ACE/ARN/ARNI due to renal dysfunction   2. Moderate to severe AS: -Echo as above with LVEF at 25-30% with mildly dilated ventricle, severely calcified aortic valve with AVA 0.87 cm2 and DVI 0.31 consistent with moderate to severe aortic stenosis>plan for [facility] today>>workup for CABG/AVR versus PCI with TAVR    3. NSTEMI with known multivessel CAD: -Found to have 3VD 01/2020>>s/p cMRI with viable myocardium with workup for CABG in [place] however never performed -HsT peak at 2156 this admission  -Continue ASA, Plavix, statin and Zetia  -Plan for [facility] today for possible intervention versus CABG  4. HTN: -Stable, 112/57>>133/61>>110/48 -Continue current regimen   5. HLD: -LDL noted to be 177 on labs this admission  -On PTA Pravastatin and Zetia>>transitioned to Atorvastatin 80 QD -Repeat panel in 6-8 weeks with LFT  6. Acute on chronic kidney disease stage IV: -Cr 2.5->2.2 -Unable to use ACEI/ARB/ARNI due to poor renal function  -Metolazone held yesterday due to rise in Cr -K+ elevated yesterday as well>>treated with Lokelma -Anemia hemoglobin today 7.4, if further drop, I will consider 1 unit of packed red blood cells.  7. PVC/ventricular bigeminy: -We will follow electrolytes closely  Ena Dawley, MD 09/27/2020

## 2020-09-28 ENCOUNTER — Inpatient Hospital Stay (HOSPITAL_COMMUNITY): Payer: Medicare Other

## 2020-09-28 DIAGNOSIS — I2510 Atherosclerotic heart disease of native coronary artery without angina pectoris: Secondary | ICD-10-CM | POA: Diagnosis not present

## 2020-09-28 DIAGNOSIS — I21A1 Myocardial infarction type 2: Secondary | ICD-10-CM | POA: Diagnosis not present

## 2020-09-28 DIAGNOSIS — I130 Hypertensive heart and chronic kidney disease with heart failure and stage 1 through stage 4 chronic kidney disease, or unspecified chronic kidney disease: Secondary | ICD-10-CM | POA: Diagnosis not present

## 2020-09-28 DIAGNOSIS — I350 Nonrheumatic aortic (valve) stenosis: Secondary | ICD-10-CM | POA: Diagnosis not present

## 2020-09-28 DIAGNOSIS — I214 Non-ST elevation (NSTEMI) myocardial infarction: Secondary | ICD-10-CM | POA: Diagnosis not present

## 2020-09-28 DIAGNOSIS — N179 Acute kidney failure, unspecified: Secondary | ICD-10-CM | POA: Diagnosis not present

## 2020-09-28 DIAGNOSIS — K5900 Constipation, unspecified: Secondary | ICD-10-CM

## 2020-09-28 DIAGNOSIS — I255 Ischemic cardiomyopathy: Secondary | ICD-10-CM

## 2020-09-28 DIAGNOSIS — I5023 Acute on chronic systolic (congestive) heart failure: Secondary | ICD-10-CM | POA: Diagnosis not present

## 2020-09-28 LAB — COOXEMETRY PANEL
Carboxyhemoglobin: 1.7 % — ABNORMAL HIGH (ref 0.5–1.5)
Methemoglobin: 0.7 % (ref 0.0–1.5)
O2 Saturation: 65 %
Total hemoglobin: 7.6 g/dL — ABNORMAL LOW (ref 12.0–16.0)

## 2020-09-28 LAB — BASIC METABOLIC PANEL
Anion gap: 8 (ref 5–15)
BUN: 48 mg/dL — ABNORMAL HIGH (ref 8–23)
CO2: 24 mmol/L (ref 22–32)
Calcium: 7.8 mg/dL — ABNORMAL LOW (ref 8.9–10.3)
Chloride: 104 mmol/L (ref 98–111)
Creatinine, Ser: 2.06 mg/dL — ABNORMAL HIGH (ref 0.44–1.00)
GFR, Estimated: 26 mL/min — ABNORMAL LOW (ref >60)
Glucose, Bld: 171 mg/dL — ABNORMAL HIGH (ref 70–99)
Potassium: 3.6 mmol/L (ref 3.5–5.1)
Sodium: 136 mmol/L (ref 135–145)

## 2020-09-28 LAB — HEPATIC FUNCTION PANEL
ALT: 15 U/L (ref 0–44)
AST: 26 U/L (ref 15–41)
Albumin: 1.8 g/dL — ABNORMAL LOW (ref 3.5–5.0)
Alkaline Phosphatase: 78 U/L (ref 38–126)
Bilirubin, Direct: <0.1 mg/dL (ref 0.0–0.2)
Total Bilirubin: 0.9 mg/dL (ref 0.3–1.2)
Total Protein: 4.9 g/dL — ABNORMAL LOW (ref 6.5–8.1)

## 2020-09-28 LAB — HEMOGLOBIN AND HEMATOCRIT, BLOOD
HCT: 24.7 % — ABNORMAL LOW (ref 36.0–46.0)
Hemoglobin: 7.7 g/dL — ABNORMAL LOW (ref 12.0–15.0)

## 2020-09-28 LAB — GLUCOSE, CAPILLARY
Glucose-Capillary: 181 mg/dL — ABNORMAL HIGH (ref 70–99)
Glucose-Capillary: 282 mg/dL — ABNORMAL HIGH (ref 70–99)
Glucose-Capillary: 242 mg/dL — ABNORMAL HIGH (ref 70–99)
Glucose-Capillary: 88 mg/dL (ref 70–99)
Glucose-Capillary: 214 mg/dL — ABNORMAL HIGH (ref 70–99)

## 2020-09-28 MED ORDER — POLYETHYLENE GLYCOL 3350 17 G PO PACK
17.0000 g | PACK | Freq: Every day | ORAL | Status: DC
  Administered 2020-09-28 – 2020-09-30 (×3): 17 g via ORAL
  Filled 2020-09-28 (×3): qty 1

## 2020-09-28 MED FILL — Verapamil HCl IV Soln 2.5 MG/ML: INTRAVENOUS | Qty: 2 | Status: AC

## 2020-09-28 NOTE — Progress Notes (Signed)
Pt asked that we please allow her son Normagene Harvie to make decisions for her while she is in the hospital in the event she can not. Carroll Kinds RN

## 2020-09-28 NOTE — Progress Notes (Addendum)
Progress Note  Patient Name: Sara [name] Date of Encounter: 09/28/2020  Primary Cardiologist: Evalina Field, MD previously followed by [place] wants to stay w/ [place] for her care. No CP or SOB overnight, is tired, but feels she is breathing well.   Inpatient Medications    Scheduled Meds: . aspirin EC  81 mg Oral Daily  . atorvastatin  80 mg Oral Daily  . Chlorhexidine Gluconate Cloth  6 each Topical Daily  . enoxaparin (LOVENOX) injection  30 mg Subcutaneous Q24H  . ezetimibe  10 mg Oral Daily  . feeding supplement (GLUCERNA SHAKE)  237 mL Oral BID BM  . furosemide  40 mg Intravenous BID  . hydrALAZINE  25 mg Oral Q8H  . insulin aspart  0-15 Units Subcutaneous TID WC  . insulin aspart  0-5 Units Subcutaneous QHS  . insulin aspart  3 Units Subcutaneous TID WC  . insulin glargine  12 Units Subcutaneous Daily  . isosorbide mononitrate  30 mg Oral Daily  . sodium chloride flush  3 mL Intravenous Q12H  . sodium chloride flush  3 mL Intravenous Q12H  . sodium chloride flush  3 mL Intravenous Q12H   Continuous Infusions: . sodium chloride 250 mL (09/26/20 1127)  . sodium chloride 250 mL (09/28/20 0133)  . milrinone 0.25 mcg/kg/min (09/28/20 0130)   PRN Meds: sodium chloride, sodium chloride, acetaminophen, ondansetron (ZOFRAN) IV, sodium chloride flush, sodium chloride flush, sodium chloride flush   Vital Signs    Vitals:   09/27/20 1415 09/27/20 1626 09/27/20 2048 09/28/20 0557  BP: 118/71 126/61 134/61 116/61  Pulse:  100 (!) 109 (!) 108  Resp:  14 16 18   Temp:  98.5 F (36.9 C) 98.7 F (37.1 C) 98.6 F (37 C)  TempSrc:  Oral Oral Oral  SpO2:   99% 100%  Weight:    62.2 kg  Height:       CVP: 14 >> 19 >> 18 COOX: 65  Intake/Output Summary (Last 24 hours) at 09/28/2020 0905 Last data filed at 09/28/2020 0133 Gross per 24 hour  Intake 909.32 ml  Output 1100 ml  Net -190.68 ml   Filed Weights   09/11/2020 0419 09/26/20 0500  09/28/20 0557  Weight: 62.6 kg 62.9 kg 62.2 kg    Physical Exam   GEN: No acute distress.   Neck: Minimal JVD Cardiac: RRR, 2-3/6 murmur, no rubs, or gallops.  Respiratory: diminished to auscultation bilaterally with some rales in the bases. GI: Soft, nontender, non-distended  MS: No edema; No deformity. Neuro:  Nonfocal  Psych: Normal affect   Labs    Chemistry Recent Labs  Lab 09/26/20 0500 09/27/20 0617 09/28/20 0500  NA 133* 134* 136  K 4.1 3.7 3.6  CL 102 105 104  CO2 21* 22 24  GLUCOSE 338* 257* 171*  BUN 65* 59* 48*  CREATININE 2.50* 2.23* 2.06*  CALCIUM 8.1* 7.7* 7.8*  GFRNONAA 20* 23* 26*  ANIONGAP 10 7 8      Hematology Recent Labs  Lab 09/26/2020 0400 09/17/2020 0948 09/26/20 0500 09/27/20 0617  WBC 7.3  --  12.2* 9.3  RBC 2.71*  --  2.53* 2.53*  HGB 8.1* 7.8* 7.6* 7.4*  HCT 25.6* 23.0* 23.4* 23.5*  MCV 94.5  --  92.5 92.9  MCH 29.9  --  30.0 29.2  MCHC 31.6  --  32.5 31.5  RDW 13.6  --  14.1 14.3  PLT 401*  --  413* 381    Cardiac Enzymes  High Sensitivity Troponin:   Recent Labs  Lab 09/28/2020 1343 10/01/2020 1608 10/02/2020 2210 09/21/20 0636 09/21/20 0832  TROPONINIHS 1,109* 1,413* 2,156* 1,643* 1,826*     No results found for: ALT, AST, GGT, ALKPHOS, BILITOT Lab Results  Component Value Date   CHOL 253 (H) 09/21/2020   HDL 66 09/21/2020   LDLCALC 177 (H) 09/21/2020   TRIG 52 09/21/2020   CHOLHDL 3.8 09/21/2020   No results found for: TSH Lab Results  Component Value Date   HGBA1C 9.3 (H) 09/26/2020   BNP    Component Value Date/Time   BNP 1,649.4 (H) 10/07/2020 0704   Magnesium  Date Value Ref Range Status  09/24/2020 2.1 1.7 - 2.4 mg/dL Final    Comment:    Performed at [facility], [place] [address] [place], [place] [address]  09/23/2020 1.9 1.7 - 2.4 mg/dL Final    Comment:    Performed at [facility] [address], [place], [place] [address]  09/21/2020 1.9 1.7 - 2.4 mg/dL Final    Comment:     Performed at [facility] [address], [place],  [address]    Radiology    DG Orthopantogram  Result Date: 09/08/2020 CLINICAL DATA:  Preop evaluation.  Aortic stenosis EXAM: ORTHOPANTOGRAM/PANORAMIC COMPARISON: None. FINDINGS: Only 6 mandibular teeth remain. Dental caries noted in the remaining incisors and right lower molar. No evidence of periapical abscess. IMPRESSION: Dental caries in the remaining teeth in the mandible.No periapical abscess. Electronically Signed   By: Rolm Baptise M.D.   On: 10/05/2020 20:38   CARDIAC CATHETERIZATION  Result Date: 09/29/2020  Ost LAD lesion is 35% stenosed.  Prox LAD to Mid LAD lesion is 95% stenosed.  Lat 1st Diag lesion is 90% stenosed.  1st Diag-1 lesion is 90% stenosed.  1st Diag-2 lesion is 80% stenosed.  Prox Cx lesion is 75% stenosed.  1st Mrg lesion is 40% stenosed.  Mid Cx to Dist Cx lesion is 75% stenosed.  Prox RCA to Mid RCA lesion is 80% stenosed.  Dist RCA lesion is 50% stenosed.  RPDA lesion is 80% stenosed.  1st RPL lesion is 80% stenosed.  1.  Severe three-vessel coronary artery disease:  Severe stenosis of the mid LAD/first diagonal with complex calcific disease  Severe stenosis of the proximal and distal circumflex  Severe stenosis of the mid RCA and the PDA/PLA 2.  Hemodynamic findings consistent with probable moderate aortic stenosis, possibly with low flow component in the setting of severe LV dysfunction.  Peak to peak gradient 16 mmHg, mean gradient 9 mmHg.  Aortic valve crossed with a J-wire. 3.  Preserved cardiac output on milrinone with cardiac index 5 L/min/m, low right heart filling pressures, LVEDP 19 mmHg. RA 2 RV 25/1 PA 29/10 mean 20 Pulmonary capillary wedge pressure 12 LVEDP 19 PA oxygen saturation 73% Ao oxygen saturation 97% Cardiac output 8.24 L/min Cardiac index 5.0 L/min/m Recommend: cardiac surgical evaluation for CABG/AVR. High risk candidate. Multidisciplinary heart team review.    Telemetry    Sinus tachycardia with frequent PVCs- Personally Reviewed  ECG    No new tracing as of 09/11/2020- Personally Reviewed  Cardiac Studies   Cardiac Cath: 09/27/2020  Ost LAD lesion is 35% stenosed.  Prox LAD to Mid LAD lesion is 95% stenosed.  Lat 1st Diag lesion is 90% stenosed.  1st Diag-1 lesion is 90% stenosed.  1st Diag-2 lesion is 80% stenosed.  Prox Cx lesion is 75% stenosed.  1st Mrg lesion is 40% stenosed.  Mid Cx to Dist Cx lesion is 75% stenosed.  Prox RCA to Mid RCA lesion is 80% stenosed.  Dist RCA lesion is 50% stenosed.  RPDA lesion is 80% stenosed.  1st RPL lesion is 80% stenosed.   1.  Severe three-vessel coronary artery disease:  Severe stenosis of the mid LAD/first diagonal with complex calcific disease  Severe stenosis of the proximal and distal circumflex  Severe stenosis of the mid RCA and the PDA/PLA  2.  Hemodynamic findings consistent with probable moderate aortic stenosis, possibly with low flow component in the setting of severe LV dysfunction.  Peak to peak gradient 16 mmHg, mean gradient 9 mmHg.  Aortic valve crossed with a J-wire.  3.  Preserved cardiac output on milrinone with cardiac index 5 L/min/m, low right heart filling pressures, LVEDP 19 mmHg.  RA 2 RV 25/1 PA 29/10 mean 20 Pulmonary capillary wedge pressure 12 LVEDP 19  PA oxygen saturation 73% Ao oxygen saturation 97%  Cardiac output 8.24 L/min Cardiac index 5.0 L/min/m  Recommend: cardiac surgical evaluation for CABG/AVR. High risk candidate. Multidisciplinary heart team review.   Diagnostic Dominance: Right     Echo: 09/21/2020 1. Left ventricular ejection fraction, by estimation, is 25 to 30%. The  left ventricle has severely decreased function. The left ventricle  demonstrates regional wall motion abnormalities (see scoring  diagram/findings for description). The left  ventricular internal cavity size was mildly dilated. There is  mild left  ventricular hypertrophy. Left ventricular diastolic parameters are  consistent with Grade II diastolic dysfunction (pseudonormalization).  There is severe akinesis of the left  ventricular, mid-apical anteroseptal wall and apical segment. There is  severe akinesis of the left ventricular, entire inferior wall.  2. Right ventricular systolic function is normal. The right ventricular  size is normal. There is mildly elevated pulmonary artery systolic  pressure.  3. Left atrial size was mildly dilated.  4. The mitral valve is grossly normal. Trivial mitral valve  regurgitation.  5. The aortic valve is calcified. There is moderate calcification of the  aortic valve. There is moderate thickening of the aortic valve. Aortic  valve regurgitation is trivial. Mild aortic valve stenosis.  Aortic valve mean gradient measures 11.8 mmHg. Aortic  valve peak gradient measures 20.6 mmHg. Aortic valve area, by VTI measures  0.89 cm.   Echocardiogram 01/26/2020 (Care Everywhere): 1. Mild LV dysfunction. EF is in the 40-45% range. The pattern is  worrisome for CAD.  2. Mild calcific aortic stenosis. Gradients are 20/11 mmHg.  3. Mild LVH _______________  Left Cardiac Catheterization 02/04/2020 (Care Everywhere): Mid LAD 70% stenosis, distal LCx 70% stenosis, mid RCA 80% stenosis, proximal RPDA 50% stenosis, left main unremarkable. _______________  Cardiac MRI 02/06/2020 (Care Everywhere): LVEF 51%, RVEF 67%, LV scar 4%, small subendocardial infarction of basal anterior/mid anterolateral wall (LAD or ramus), small subendocardial infarction of mid inferoseptum (RCA), dysfunctional myocardium in all 3 coronary artery distributions predominantly viable, mild-moderate AS (AVA 1.0cm2, peak gradient 56mmHg) Films in Epic now  Patient Profile     70 y.o. female history of 3 vessel CAD noted on cardiac catheterization in 01/2020, ischemic cardiomyopathy/chronic systolic CHF, stroke,  hypertension, diabetes mellitus, CKD III-IV, admitted 03/13 for CHF and NSTEMI  Assessment & Plan    1. Acute on chronic CHF with ischemic cardiomyopathy: - Echo above, EF 25-30%, mod-severe AS - COOX ok , but CVP trending up, continue Lasix IV -  known hx multi-vessel dz, never had CABG while in [place] - Dr Cyndia Bent has seen, CABG w/ AVR planned later this week or next - Continue milrinone at 0.25 mcg/kg/hr - Cr approx 1.6 at baseline, bumped after cath, trending down - continue Imdur and hydralazine - no other therapies due to poor renal function  2. Moderate to severe AS: - echo report above, suspect low-flow, low-gradient severe AS - for AVR at time of CABG  3. NSTEMI with known multivessel CAD: - Hx multivessel dz a/ viable myocardium at cMRI in [place] - on ASA, statin, Zetia - Plavix d/c'd after cath - CABG w/ AVR planned later this week or next  4. HTN: - BP well-controlled on current rx - not on BB at home or here - discuss w/ MD if pt might tolerate low-dose Coreg or Toprol XL  5. HLD: - profile above - pta rx pravastatin and Zetia, now Lipitor 80 mg qd and Zetia - ck liver function  6. Acute on chronic kidney disease stage IV: - Cr 1.62 02/2020 - Cr 1.90 on admit >> 1.74 >> 2.50 after cath >> 2.02 - no ACE/ARB/ARNI - metolazone on hold since 03/16 - got 1 dose Lokelma 03/16 for K+ 5.5 - continue to follow  7. PVC/ventricular bigeminy: - frequent PVCs and occ triplets - Mg ok, recheck in am - supp K+ at 40 meq qd   Rosaria Ferries, PA-C 09/28/2020

## 2020-09-28 NOTE — Care Management Important Message (Signed)
Important Message  Patient Details  Name: Sara [name] MRN: [id number] Date of Birth: [birth year]   Medicare Important Message Given:  Yes     Shelda Altes 09/28/2020, 11:48 AM

## 2020-09-28 NOTE — Progress Notes (Signed)
Pt had mild epistaxis.  O2 removed with sats 92-95% on room air.  Humidification applied to O2 if needed.

## 2020-09-28 NOTE — Progress Notes (Signed)
[id number] 114 ST PVCs  99%RA Came to offer to walk. Pt declined and stated she is tired. Pt knows it will be important to walk and use IS after surgery. Pt waiting for definite plan. Will do pre op ed and give IS when surgery date determined and plan finalized. Graylon Good RN BSN 09/28/2020 10:01 AM

## 2020-09-28 NOTE — Progress Notes (Addendum)
   Subjective:  No acute overnight events.  Doing fine, has been able to eat her meals. States that she has not been able to pass a BM in a couple of days. She has been passing flatus and has had no issues with tolerating PO intake. She states that she usually passes about 2 bowel movements a day at home, but this is also because she eats a lot more when she is home.  Objective:  Vital signs in last 24 hours: Vitals:   09/27/20 1415 09/27/20 1626 09/27/20 2048 09/28/20 0557  BP: 118/71 126/61 134/61 116/61  Pulse:  100 (!) 109 (!) 108  Resp:  14 16 18   Temp:  98.5 F (36.9 C) 98.7 F (37.1 C) 98.6 F (37 C)  TempSrc:  Oral Oral Oral  SpO2:   99% 100%  Weight:    62.2 kg  Height:       General: pleasant elderly woman, sitting up in chair, NAD. CV: slightly tachycardic rate and regular rhythm, 3/6 systolic murmur, no JVD. Pulm: normal work of breathing, minimal bibasilar crackles. Abdomen: soft, nondistended, nontender to palpation. Bowel sounds present. Extremities: no peripheral edema bilaterally. Neuro: AAOx4, no focal deficits.  Assessment/Plan:  Principal Problem:   Acute on chronic HFrEF (heart failure with reduced ejection fraction) (HCC) Active Problems:   NSTEMI (non-ST elevated myocardial infarction) ([place])   Diabetes mellitus ([place])   Essential hypertension   Hyperlipidemia   CKD (chronic kidney disease), stage III (HCC)   CAD (coronary artery disease)   Severe aortic stenosis  70 year old person with ischemic heart disease, T2DM, HTN, HLD, and CKD stage IIIb admitted for management of acute on chronic HFrEF, now s/p cardiac catheterization and awaiting inpatient CABG.  Acute on chronic systolic heart failure  Moderate to Severe AS Type 2 NSTEMI with know CAD Patient was admitted with acute on chronic heart failure exacerbation.  No active chest pain but patients troponins peaked at 2156. Patient undergoing work-up for planned CABG and aortic valve replacement  next week with CT surgeon, Dr. Cyndia Bent. Will need to undergo dental extraction for a tooth infection she has prior to CABG. Cardiology following, appreciate assistance. On milrinone drip, hydralazine, imdur. Patient on IV lasix 40mg  BID. Also continuing ASA, zetia, and lipitor 80mg . Plavix has been stopped in anticipation for CABG and AVR.   Anemia Hemoglobin stable today 7.7  -Transfuse for symptomatic anemia or hemoglobin less than 7  Acute on CKD stage IIIb Baseline creatinine 1.5, trend 1.85 > 2.15 > 2.5 > 2.23 > 2.06.  Creatinine improving with increased milrinone drip.   - Trend BMP  Constipation Has not passed a bowel movement in past couple of days. No N/V/abdominal pain, has been passing flatus, and has been able to tolerate PO intake so this is likely mild constipation. Will add scheduled miralax daily.  Prior to Admission Living Arrangement: Home (in [place], [place]) Anticipated Discharge Location: TBD Barriers to Discharge: continued medical management Dispo: Anticipated discharge after CABG and AVR.   Virl Axe, MD 09/28/2020, 12:57 PM Pager: [phone] After 5pm on weekdays and 1pm on weekends: On Call pager [phone]

## 2020-09-29 ENCOUNTER — Inpatient Hospital Stay (HOSPITAL_COMMUNITY): Payer: Medicare Other

## 2020-09-29 DIAGNOSIS — M79606 Pain in leg, unspecified: Secondary | ICD-10-CM | POA: Diagnosis not present

## 2020-09-29 DIAGNOSIS — M79604 Pain in right leg: Secondary | ICD-10-CM

## 2020-09-29 DIAGNOSIS — K036 Deposits [accretions] on teeth: Secondary | ICD-10-CM

## 2020-09-29 DIAGNOSIS — K029 Dental caries, unspecified: Secondary | ICD-10-CM

## 2020-09-29 DIAGNOSIS — K08109 Complete loss of teeth, unspecified cause, unspecified class: Secondary | ICD-10-CM

## 2020-09-29 DIAGNOSIS — N1832 Chronic kidney disease, stage 3b: Secondary | ICD-10-CM

## 2020-09-29 DIAGNOSIS — I214 Non-ST elevation (NSTEMI) myocardial infarction: Secondary | ICD-10-CM | POA: Diagnosis not present

## 2020-09-29 DIAGNOSIS — I350 Nonrheumatic aortic (valve) stenosis: Secondary | ICD-10-CM | POA: Diagnosis not present

## 2020-09-29 DIAGNOSIS — E782 Mixed hyperlipidemia: Secondary | ICD-10-CM | POA: Diagnosis not present

## 2020-09-29 DIAGNOSIS — Z972 Presence of dental prosthetic device (complete) (partial): Secondary | ICD-10-CM | POA: Diagnosis not present

## 2020-09-29 DIAGNOSIS — Z01818 Encounter for other preprocedural examination: Secondary | ICD-10-CM

## 2020-09-29 DIAGNOSIS — K056 Periodontal disease, unspecified: Secondary | ICD-10-CM

## 2020-09-29 DIAGNOSIS — K0889 Other specified disorders of teeth and supporting structures: Secondary | ICD-10-CM

## 2020-09-29 DIAGNOSIS — K011 Impacted teeth: Secondary | ICD-10-CM

## 2020-09-29 DIAGNOSIS — I5023 Acute on chronic systolic (congestive) heart failure: Secondary | ICD-10-CM | POA: Diagnosis not present

## 2020-09-29 LAB — CBC
HCT: 25.3 % — ABNORMAL LOW (ref 36.0–46.0)
Hemoglobin: 7.9 g/dL — ABNORMAL LOW (ref 12.0–15.0)
MCH: 29.2 pg (ref 26.0–34.0)
MCHC: 31.2 g/dL (ref 30.0–36.0)
MCV: 93.4 fL (ref 80.0–100.0)
Platelets: 398 10*3/uL (ref 150–400)
RBC: 2.71 MIL/uL — ABNORMAL LOW (ref 3.87–5.11)
RDW: 14.6 % (ref 11.5–15.5)
WBC: 8.3 10*3/uL (ref 4.0–10.5)
nRBC: 0 % (ref 0.0–0.2)

## 2020-09-29 LAB — BASIC METABOLIC PANEL
Anion gap: 10 (ref 5–15)
BUN: 44 mg/dL — ABNORMAL HIGH (ref 8–23)
CO2: 24 mmol/L (ref 22–32)
Calcium: 8.3 mg/dL — ABNORMAL LOW (ref 8.9–10.3)
Chloride: 103 mmol/L (ref 98–111)
Creatinine, Ser: 2.21 mg/dL — ABNORMAL HIGH (ref 0.44–1.00)
GFR, Estimated: 24 mL/min — ABNORMAL LOW (ref >60)
Glucose, Bld: 154 mg/dL — ABNORMAL HIGH (ref 70–99)
Potassium: 3.7 mmol/L (ref 3.5–5.1)
Sodium: 137 mmol/L (ref 135–145)

## 2020-09-29 LAB — COOXEMETRY PANEL
Carboxyhemoglobin: 1.9 % — ABNORMAL HIGH (ref 0.5–1.5)
Methemoglobin: 1.2 % (ref 0.0–1.5)
O2 Saturation: 55.3 %
Total hemoglobin: 7.4 g/dL — ABNORMAL LOW (ref 12.0–16.0)

## 2020-09-29 LAB — GLUCOSE, CAPILLARY
Glucose-Capillary: 170 mg/dL — ABNORMAL HIGH (ref 70–99)
Glucose-Capillary: 125 mg/dL — ABNORMAL HIGH (ref 70–99)
Glucose-Capillary: 346 mg/dL — ABNORMAL HIGH (ref 70–99)
Glucose-Capillary: 218 mg/dL — ABNORMAL HIGH (ref 70–99)

## 2020-09-29 LAB — PREALBUMIN: Prealbumin: 15.1 mg/dL — ABNORMAL LOW (ref 18–38)

## 2020-09-29 LAB — MAGNESIUM: Magnesium: 2 mg/dL (ref 1.7–2.4)

## 2020-09-29 NOTE — Progress Notes (Signed)
CARDIAC REHAB PHASE I   PRE:  Rate/Rhythm: 119 ST PVCs  BP:  Supine:   Sitting: 134/66  Standing:    SaO2: 98%RA  MODE:  Ambulation: 120 ft   POST:  Rate/Rhythm: 136 ST PVCs  BP:  Supine:   Sitting: 141/70  Standing:    SaO2: 99%RA 1127-1210 OK to walk per cardiology. Pt walked 120 ft on RA with asst x 1 with slow steady gait. Denied CP or pain in right foot. Stated right foot a little numb. To sitting on side of bed. HR elevated to 136 during walk. Gave pt IS and she demonstrated 600 ml. Encouraged to practice. Gave [facility] booklet, care guide and staying in the tube handout. Discussed importance of walking and IS after surgery. Discussed sternal precautions. Pt not interested in watching pre op video at this time. Wrote down how to view if interested in future. Going to stay with son after discharge.   Graylon Good, RN BSN  09/29/2020 12:05 PM

## 2020-09-29 NOTE — Progress Notes (Signed)
Pt reporting new 5/10 sharp R-leg pain in calf region as well as numbness/tingling in R-foot. All pain and numbness is uni-lateral to R-side. R-calf is tender to touch, no edema/redness/warmth noted. Pt dorsalis and tibialis pulses present. MD notified, orders placed for R-LE venous doppler for DVT rule-out. PRN analgesic given for pain, will continue to monitor.

## 2020-09-29 NOTE — Progress Notes (Addendum)
Pre-CABG and RLE venous completed   RN given results to relay to Cardiology.   Please see CV Proc for preliminary results.   Vonzell Schlatter, RVT

## 2020-09-29 NOTE — Progress Notes (Signed)
Department of Dental Medicine     INPATIENT CONSULTATION  Service Date:   09/29/2020 Admitted Date:  09/28/2020  Patient Name:  Sara [name] Date of Birth:   02/04/1951 Medical Record Number: [id number]  Referring Provider:              Gilford Raid, MD  PLAN & RECOMMENDATIONS   > There are no current signs of acute odontogenic infection including abscess, edema or erythema, or suspicious lesion requiring biopsy.  The patient does have a few remaining teeth with caries and moderate to severe bone loss indicative of chronic periodontal disease. >> Recommend comprehensive dental care to address caries and periodontal concerns and to decrease the risk of perioperative and postoperative systemic infection and complications.  Do not necessarily recommend treatment prior to cardiac surgery since there is nothing acute and patient is not in pain.  1 or 2 teeth may be restorable if she addresses her oral health and this would give the patient a better option of a partial denture vs complete denture.  She will likely need extractions and periodontal therapy and definitive treatment plan made after a more thorough comprehensive exam. >>> Plan to discuss case with medical team and coordinate treatment as needed.  >>  Discussed in detail all treatment options with the patient and they are agreeable to the plan.   Thank you for consulting with Hospital Dentistry and for the opportunity to participate in this patient's treatment.  Should you have any questions or concerns, please contact the [facility] at [phone].   09/29/2020     CONSULT NOTE   COVID 19 SCREENING: The patient denies symptoms concerning for COVID-19 infection including fever, chills, cough, or newly developed shortness of breath.   HISTORY OF PRESENT ILLNESS: >> Sara [name] is a very pleasant 70 y.o. female with h/o CKD, CAD, T2 diabetes mellitus, HTN, hyperlipidemia and recent diagnosis of severe aortic  stenosis who is currently admitted for management of acute on chronic HFrEF s/p cardiac catheterization and is awaiting inpatient CABG vs AVR.  [facility]ospital [facility] was consulted as part of the patient's pre-cardiac surgical work-up.  DENTAL HISTORY: >The patient reports that she does not have a dentist that she sees regularly.  She states that she has an upper complete denture and a lower partial denture that were made several years ago. She still wears her upper denture, but no longer wears her lower partial since it does not fit well. She currently denies any dental/orofacial pain or sensitivity. >> Patient is able to manage oral secretions.  Patient denies dysphagia, odynophagia, dysphonia, SOB and neck pain.  Patient denies fever, rigors and malaise.   CHIEF COMPLAINT: Preoperative inpatient dental evaluation.   Patient Active Problem List   Diagnosis Date Noted  . CAD (coronary artery disease)   . Severe aortic stenosis   . CKD (chronic kidney disease), stage III ([place]) 09/21/2020  . NSTEMI (non-ST elevated myocardial infarction) ([place])   . Acute on chronic HFrEF (heart failure with reduced ejection fraction) ([place]) 10/08/2020  . Monoclonal gammopathy of unknown significance (MGUS) 03/02/2020  . Iron deficiency anemia 01/29/2020  . Peripheral arterial occlusive disease ([place]) 05/24/2018  . Heart murmur 01/25/2018  . 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 Essential hypertension 06/09/2017  . Hyperlipidemia 06/09/2017  . Diabetes mellitus ([place]) 05/07/2011   Past Medical History:  Diagnosis Date  . CHF (congestive heart failure) ([place])   . Diabetes mellitus without complication ([place])   .  Hypertension   . Stroke [facility]) 2018   Reports right sided stroke, continues to have some slurred speech, but no motor defecients    Past Surgical History:  Procedure Laterality Date  . CAROTID ENDARTERECTOMY    . RIGHT/LEFT HEART CATH AND  CORONARY ANGIOGRAPHY N/A 10/01/2020   Procedure: RIGHT/LEFT HEART CATH AND CORONARY ANGIOGRAPHY;  Surgeon: Sherren Mocha, MD;  Location: [facility];  Service: Cardiovascular;  Laterality: N/A;   Allergies  Allergen Reactions  . Contrast Media [Iodinated Diagnostic Agents]   . Naproxen Nausea And Vomiting   Current Facility-Administered Medications  Medication Dose Route Frequency Provider Last Rate Last Admin  . 0.9 %  sodium chloride infusion  250 mL Intravenous PRN Sherren Mocha, MD 10 mL/hr at 09/28/20 0133 250 mL at 09/28/20 0133  . acetaminophen (TYLENOL) tablet 650 mg  650 mg Oral Q4H PRN Madalyn Rob, MD   650 mg at 09/29/20 0904  . aspirin EC tablet 81 mg  81 mg Oral Daily Madalyn Rob, MD   81 mg at 09/29/20 3267  . atorvastatin (LIPITOR) tablet 80 mg  80 mg Oral Daily Sande Rives E, PA-C   80 mg at 09/28/20 1953  . Chlorhexidine Gluconate Cloth 2 % PADS 6 each  6 each Topical Daily Angelica Pou, MD   6 each at 09/28/20 1600  . enoxaparin (LOVENOX) injection 30 mg  30 mg Subcutaneous Q24H Rebbeca Paul B, RPH   30 mg at 09/29/20 1245  . ezetimibe (ZETIA) tablet 10 mg  10 mg Oral Daily Madalyn Rob, MD   10 mg at 09/29/20 0905  . feeding supplement (GLUCERNA SHAKE) (GLUCERNA SHAKE) liquid 237 mL  237 mL Oral BID BM Madalyn Rob, MD   237 mL at 09/28/20 1025  . furosemide (LASIX) injection 40 mg  40 mg Intravenous BID Dorothy Spark, MD   40 mg at 09/29/20 0905  . hydrALAZINE (APRESOLINE) tablet 25 mg  25 mg Oral Q8H O'Neal, Cassie Freer, MD   25 mg at 09/29/20 0527  . insulin aspart (novoLOG) injection 0-15 Units  0-15 Units Subcutaneous TID WC Madalyn Rob, MD   3 Units at 09/29/20 [phone]  . insulin aspart (novoLOG) injection 0-5 Units  0-5 Units Subcutaneous QHS Madalyn Rob, MD   2 Units at 09/28/20 2203  . insulin aspart (novoLOG) injection 3 Units  3 Units Subcutaneous TID WC Madalyn Rob, MD   3 Units at 09/29/20 [phone]  . insulin glargine (LANTUS)  injection 12 Units  12 Units Subcutaneous Daily Virl Axe, MD   12 Units at 09/29/20 [phone]  . isosorbide mononitrate (IMDUR) 24 hr tablet 30 mg  30 mg Oral Daily O'Neal, Cassie Freer, MD   30 mg at 09/29/20 0905  . milrinone (PRIMACOR) 20 MG/100 ML (0.2 mg/mL) infusion  0.25 mcg/kg/min Intravenous Continuous Dorothy Spark, MD 4.72 mL/hr at 09/28/20 2207 0.25 mcg/kg/min at 09/28/20 2207  . ondansetron (ZOFRAN) injection 4 mg  4 mg Intravenous Q6H PRN Sherren Mocha, MD      . polyethylene glycol (MIRALAX / GLYCOLAX) packet 17 g  17 g Oral Daily Cato Mulligan, MD   17 g at 09/29/20 0905  . sodium chloride flush (NS) 0.9 % injection 10-40 mL  10-40 mL Intracatheter PRN Angelica Pou, MD        LABS: Lab Results  Component Value Date   WBC 8.3 09/29/2020   HGB 7.9 (L) 09/29/2020   HCT 25.3 (L) 09/29/2020   MCV 93.4 09/29/2020  PLT 398 09/29/2020      Component Value Date/Time   NA 137 09/29/2020 0439   K 3.7 09/29/2020 0439   CL 103 09/29/2020 0439   CO2 24 09/29/2020 0439   GLUCOSE 154 (H) 09/29/2020 0439   BUN 44 (H) 09/29/2020 0439   CREATININE 2.21 (H) 09/29/2020 0439   CALCIUM 8.3 (L) 09/29/2020 0439   GFRNONAA 24 (L) 09/29/2020 0439   Lab Results  Component Value Date   INR 1.0 09/29/2020   No results found for: PTT  Social History   Socioeconomic History  . Marital status: Single    Spouse name: Not on file  . Number of children: Not on file  . Years of education: Not on file  . Highest education level: Not on file  Occupational History  . Not on file  Tobacco Use  . Smoking status: Current Every Day Smoker    Packs/day: 0.50    Types: Cigarettes    Start date: 56  . Smokeless tobacco: Never Used  Substance and Sexual Activity  . Alcohol use: Not Currently  . Drug use: Never  . Sexual activity: Not on file  Other Topics Concern  . Not on file  Social History Narrative  . Not on file   Social Determinants of Health   Financial  Resource Strain: Not on file  Food Insecurity: Not on file  Transportation Needs: Not on file  Physical Activity: Not on file  Stress: Not on file  Social Connections: Not on file  Intimate Partner Violence: Not on file   History reviewed. No pertinent family history.   REVIEW OF SYSTEMS: Reviewed with the patient as per HPI. PSYCH: (+) Dental phobia  VITAL SIGNS: BP 110/65 (BP Location: Left Arm)   Pulse (!) 103   Temp 99.3 F (37.4 C) (Oral)   Resp 18   Ht 5\' 2"  (1.575 m)   Wt 62.6 kg   SpO2 99%   BMI 25.26 kg/m    PHYSICAL EXAM: >> General:  Well-developed, comfortable and in no apparent distress. >> Neurological:  Alert and oriented to person, place and  time. >> Extraoral:  Facial symmetry present without any edema or erythema.  No swelling or lymphadenopathy.  TMJ asymptomatic without clicks or crepitations. >> Intraoral:  Soft tissues appear well-perfused and mucous membranes moist.  FOM and vestibules soft and not raised. Oral cavity without mass or lesion. No signs of infection, parulis, sinus tract, edema or erythema evident upon exam.   DENTAL EXAM: All clinical findings charted.   >> Dentition:  Overall poor remaining dentition.  Missing teeth, caries, existing restorations. >> The patient is maintaining poor oral hygiene.  >> Periodontal: Slightly inflamed, erythematous gingival tissue. Generalized plaque and calculus accumulation.  Generalized gingival recession. Tooth #21 and #31 Class II mobility. >> Caries: #21 distal, #27 distal, #31 distal >> Removable/Fixed Prosthodontics: The patient has an upper complete denture that she wears regularly and a lower partial denture that she no longer wears due to poor fit. >> Occlusion: Unable to assess molar occlusion.  Non-functional remaining dentition. Supra-erupted teeth numbers 21, 27, 28 and 31. #21 and #31 drifting towards the mesial, #27 and #28 drifting towards the distal.    RADIOGRAPHIC EXAM:  09/27/2020  Orthopanogram interpreted. >> Condyles seated bilaterally in fossas.  No evidence of abnormal pathology.  All visualized osseous structures appear WNL. >> Generalized moderate to severe horizontal bone loss consistent with moderate to severe periodontitis. Radiographic calculus accumulation evident. #21 has mesial  and distal vertical bone loss and widened PDL. >> Missing all teeth except erupted #21, #27, #28 and #31. #22 is completely bony impacted in the anterior mandible horizontally and appears to be crossing the midline with no signs of radiolucency. Caries- #21D, #27D; #31 has deep distal decay approximating the pulp.   ASSESSMENT:  1. Severe aortic stenosis 2. Preoperative dental consultation 3. Currently on anticoagulation (Lovenox) 4. Type 2 diabetes mellitus 5. Missing teeth 6. Caries 7. Accretions on teeth 8. Chronic periodontitis 9. Loose teeth 10. Impacted tooth 11. Gingival recession 12. Ill-fitting dentures 13. 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  I explained all significant findings of the dental consultation with the patient including teeth with cavities, slightly mobile or loose teeth and tartar or plaque accumulation and the recommended care including comprehensive dental treatment in order to optimize them from a dental standpoint.  I discussed that although she does have important dental concerns that need to be addressed, they do not necessarily need to be done prior to her heart surgery since she does not have any pain or signs of acute infection. Recommend addressing her  oral health as soon as possible following her heart surgery and recovery, pending medical team's recommendations. The patient verbalized understanding of all findings, discussion, and recommendations. >> We then discussed various treatment options to include no treatment, multiple extractions with alveoloplasty, pre-prosthetic surgery as indicated, periodontal therapy, dental restorations, root canal therapy, crown and bridge therapy, implant therapy, and replacement of missing teeth as indicated.  The patient verbalized understanding of all options, and currently wishes to proceed with any indicated/recommended dental treatment including extractions and periodontal therapy/restorative prior to or following her medical optimization and recovery. >>>  Plan to discuss all findings and recommendations with medical team and coordinate future care as needed.  <> The patient tolerated today's visit well.  All questions and concerns were addressed and answered before conclusion of the consultation.   [place], D.M.D.

## 2020-09-29 NOTE — Progress Notes (Signed)
Progress Note  Patient Name: Sara [name] Date of Encounter: 09/29/2020  Primary Cardiologist: Dr. Eleonore Chiquito, MD   Subjective   No specific complaints today. Anticipating CABG.AVR later this week versus next week   Inpatient Medications    Scheduled Meds: . aspirin EC  81 mg Oral Daily  . atorvastatin  80 mg Oral Daily  . Chlorhexidine Gluconate Cloth  6 each Topical Daily  . enoxaparin (LOVENOX) injection  30 mg Subcutaneous Q24H  . ezetimibe  10 mg Oral Daily  . feeding supplement (GLUCERNA SHAKE)  237 mL Oral BID BM  . furosemide  40 mg Intravenous BID  . hydrALAZINE  25 mg Oral Q8H  . insulin aspart  0-15 Units Subcutaneous TID WC  . insulin aspart  0-5 Units Subcutaneous QHS  . insulin aspart  3 Units Subcutaneous TID WC  . insulin glargine  12 Units Subcutaneous Daily  . isosorbide mononitrate  30 mg Oral Daily  . polyethylene glycol  17 g Oral Daily  . sodium chloride flush  3 mL Intravenous Q12H  . sodium chloride flush  3 mL Intravenous Q12H  . sodium chloride flush  3 mL Intravenous Q12H   Continuous Infusions: . sodium chloride 250 mL (09/26/20 1127)  . sodium chloride 250 mL (09/28/20 0133)  . milrinone 0.25 mcg/kg/min (09/28/20 2207)   PRN Meds: sodium chloride, sodium chloride, acetaminophen, ondansetron (ZOFRAN) IV, sodium chloride flush, sodium chloride flush, sodium chloride flush   Vital Signs    Vitals:   09/28/20 0557 09/28/20 1521 09/28/20 2017 09/29/20 0445  BP: 116/61 127/61 110/65   Pulse: (!) 108 (!) 106 (!) 103   Resp: 18 18    Temp: 98.6 F (37 C) 98.6 F (37 C) 99.3 F (37.4 C)   TempSrc: Oral Oral Oral   SpO2: 100% 100% 99%   Weight: 62.2 kg   62.6 kg  Height:        Intake/Output Summary (Last 24 hours) at 09/29/2020 0735 Last data filed at 09/29/2020 0000 Gross per 24 hour  Intake 932.14 ml  Output 550 ml  Net 382.14 ml   Filed Weights   09/26/20 0500 09/28/20 0557 09/29/20 0445  Weight: 62.9 kg 62.2 kg 62.6  kg    Physical Exam   General: Well developed, well nourished, NAD Neck: Negative for carotid bruits. No JVD Lungs:Clear to ausculation bilaterally. No wheezes, rales, or rhonchi. Breathing is unlabored. Cardiovascular: RRR with S1 S2. + murmur Abdomen: Soft, non-tender, non-distended. No obvious abdominal masses. Extremities: No edema. Radial pulses 2+ bilaterally Neuro: Alert and oriented. No focal deficits. No facial asymmetry. MAE spontaneously. Psych: Responds to questions appropriately with normal affect.    Labs    Chemistry Recent Labs  Lab 09/27/20 0617 09/28/20 0500 09/29/20 0439  NA 134* 136 137  K 3.7 3.6 3.7  CL 105 104 103  CO2 22 24 24   GLUCOSE 257* 171* 154*  BUN 59* 48* 44*  CREATININE 2.23* 2.06* 2.21*  CALCIUM 7.7* 7.8* 8.3*  PROT  --  4.9*  --   ALBUMIN  --  1.8*  --   AST  --  26  --   ALT  --  15  --   ALKPHOS  --  78  --   BILITOT  --  0.9  --   GFRNONAA 23* 26* 24*  ANIONGAP 7 8 10      Hematology Recent Labs  Lab 09/26/20 0500 09/27/20 0617 09/28/20 0500 09/29/20 2951  WBC 12.2* 9.3  --  8.3  RBC 2.53* 2.53*  --  2.71*  HGB 7.6* 7.4* 7.7* 7.9*  HCT 23.4* 23.5* 24.7* 25.3*  MCV 92.5 92.9  --  93.4  MCH 30.0 29.2  --  29.2  MCHC 32.5 31.5  --  31.2  RDW 14.1 14.3  --  14.6  PLT 413* 381  --  398    Cardiac EnzymesNo results for input(s): TROPONINI in the last 168 hours. No results for input(s): TROPIPOC in the last 168 hours.   BNPNo results for input(s): BNP, PROBNP in the last 168 hours.   DDimer No results for input(s): DDIMER in the last 168 hours.   Radiology    No results found.  Telemetry    09/29/20 ST with HR in the low 100's, intermittent PVCs - Personally Reviewed  ECG    No new tracing as of 09/29/20- Personally Reviewed  Cardiac Studies   Cardiac Cath: 10/08/2020  Ost LAD lesion is 35% stenosed.  Prox LAD to Mid LAD lesion is 95% stenosed.  Lat 1st Diag lesion is 90% stenosed.  1st Diag-1 lesion is  90% stenosed.  1st Diag-2 lesion is 80% stenosed.  Prox Cx lesion is 75% stenosed.  1st Mrg lesion is 40% stenosed.  Mid Cx to Dist Cx lesion is 75% stenosed.  Prox RCA to Mid RCA lesion is 80% stenosed.  Dist RCA lesion is 50% stenosed.  RPDA lesion is 80% stenosed.  1st RPL lesion is 80% stenosed.  1. Severe three-vessel coronary artery disease:  Severe stenosis of the mid LAD/first diagonal with complex calcific disease  Severe stenosis of the proximal and distal circumflex  Severe stenosis of the mid RCA and the PDA/PLA  2. Hemodynamic findings consistent with probable moderate aortic stenosis, possibly with low flow component in the setting of severe LV dysfunction. Peak to peak gradient 16 mmHg, mean gradient 9 mmHg. Aortic valve crossed with a J-wire.  3. Preserved cardiac output on milrinone with cardiac index 5 L/min/m, low right heart filling pressures, LVEDP 19 mmHg.  RA 2 RV 25/1 PA 29/10 mean 20 Pulmonary capillary wedge pressure 12 LVEDP 19  PA oxygen saturation 73% Ao oxygen saturation 97%  Cardiac output 8.24 L/min Cardiac index 5.0 L/min/m  Recommend: cardiac surgical evaluation for CABG/AVR. High risk candidate. Multidisciplinary heart team review.  Diagnostic Dominance: Right     Echo: 09/21/2020 1. Left ventricular ejection fraction, by estimation, is 25 to 30%. The  left ventricle has severely decreased function. The left ventricle  demonstrates regional wall motion abnormalities (see scoring  diagram/findings for description). The left  ventricular internal cavity size was mildly dilated. There is mild left  ventricular hypertrophy. Left ventricular diastolic parameters are  consistent with Grade II diastolic dysfunction (pseudonormalization).  There is severe akinesis of the left  ventricular, mid-apical anteroseptal wall and apical segment. There is  severe akinesis of the left ventricular, entire inferior wall.   2. Right ventricular systolic function is normal. The right ventricular  size is normal. There is mildly elevated pulmonary artery systolic  pressure.  3. Left atrial size was mildly dilated.  4. The mitral valve is grossly normal. Trivial mitral valve  regurgitation.  5. The aortic valve is calcified. There is moderate calcification of the  aortic valve. There is moderate thickening of the aortic valve. Aortic  valve regurgitation is trivial. Mild aortic valve stenosis.  Aortic valve mean gradient measures 11.8 mmHg. Aortic  valve peak gradient measures 20.6  mmHg. Aortic valve area, by VTI measures  0.89 cm.   Echocardiogram 01/26/2020 (Care Everywhere): 1. Mild LV dysfunction. EF is in the 40-45% range. The pattern is  worrisome for CAD.  2. Mild calcific aortic stenosis. Gradients are 20/11 mmHg.  3. Mild LVH _______________  Left Cardiac Catheterization 02/04/2020 (Care Everywhere): Mid LAD 70% stenosis, distal LCx 70% stenosis, mid RCA 80% stenosis, proximal RPDA 50% stenosis, left main unremarkable. _______________  Cardiac MRI 02/06/2020 (Care Everywhere): LVEF 51%, RVEF 67%, LV scar 4%, small subendocardial infarction of basal anterior/mid anterolateral wall (LAD or ramus), small subendocardial infarction of mid inferoseptum (RCA), dysfunctional myocardium in all 3 coronary artery distributions predominantly viable, mild-moderate AS (AVA 1.0cm2, peak gradient 68mmHg) Films in Epic now  Patient Profile     70 y.o. female history of 3 vessel CAD noted on cardiac catheterization in 01/2020, ischemic cardiomyopathy/chronic systolic CHF, stroke, hypertension, diabetes mellitus, CKD III-IV, admitted 03/13 for CHF and NSTEMI  Assessment & Plan    1. Acute on chronic CHF with ischemic cardiomyopathy: -Echo performed 09/24/20 with LVEF at 25-30% with mildly dilated ventricle, severely calcified aortic valve with AVA 0.87 cm2 and DVI 0.31 consistent with moderate to  severe aortic stenosis>>she has known multi vessel CAD with prior plan for CABG while in [place] however this was never performed. LHC 10/08/2020 showed severe CAD and at least moderate aortic stenosis, she ultimately needs CABG with AVR. Evaluated by Dr. Cyndia Bent with plans for CABG with AVR end of this week/next week  -Continue milrinone 0.22mcg/kg/hr -Co-ox>>73>>65>>55 today>>plan per notes yesterday was to consider transfusion if Co-ox continues to dip>>to discuss with MD   -Continue Imdur and hydralazine -Cr, 2.21 today>>up from 2.06 yesterday  -Unable to start ACE/ARN/ARNI due to renal dysfunction   2. Moderate to severe AS: -Echo as above with LVEF at 25-30% with mildly dilated ventricle, severely calcified aortic valve with AVA 0.87 cm2 and DVI 0.31 consistent with moderate to severe aortic stenosis with plans for CABG/AVR with Dr. Cyndia Bent -Suspect low flow/low gradient severe AS  3. NSTEMI with known multivessel CAD: -Found to have 3VD 01/2020>>s/p cMRI with viable myocardium with workup for CABG in [place] however never performed -HsT peak at 2156 this admission  -Continue ASA, Plavix, statin and Zetia  -Plan for CABG/AVR this week  4. HTN: -Stable, 112/57>>133/61>>110/48 -Continue current regimen   5. HLD: -LDL noted to be 177 on labs this admission  -On PTA Pravastatin and Zetia>>transitioned to Atorvastatin 80 QD -Repeat panel in 6-8 weeks with LFT  6. Acute on chronic kidney disease stage IV: -Cr, 2.21 today>>up from 2.06 yesterday  -Unable to use ACEI/ARB/ARNI due to poor renal function   7. PVC/ventricular bigeminy: -Intermittent PVCs however not as frequent>>ST on telemetry   Signed, Kathyrn Drown NP-C [facility] Pager: [phone] 09/29/2020, 7:35 AM     For questions or updates, please contact   Please consult [email or web address] for contact info under Cardiology/STEMI.

## 2020-09-29 NOTE — Plan of Care (Signed)

## 2020-09-29 NOTE — Progress Notes (Signed)
    Vital Signs @MEWSNOTE @   Yellow MEWS for slightly elevated HR. Nighttime Hydrolyzine admin. No acute distress. Will continue to monitor    Sara [name] 09/29/2020,9:24 PM

## 2020-09-29 NOTE — Progress Notes (Signed)
   09/29/20 1621  Assess: MEWS Score  Temp 98.9 F (37.2 C)  BP 115/65  Pulse Rate (!) 112  ECG Heart Rate (!) 112  Resp 18  SpO2 97 %  Assess: MEWS Score  MEWS Temp 0  MEWS Systolic 0  MEWS Pulse 2  MEWS RR 0  MEWS LOC 0  MEWS Score 2  MEWS Score Color Yellow  Assess: if the MEWS score is Yellow or Red  Were vital signs taken at a resting state? No  Focused Assessment No change from prior assessment  Early Detection of Sepsis Score *See Row Information* Low  MEWS guidelines implemented *See Row Information* Yes  Take Vital Signs  Increase Vital Sign Frequency  Yellow: Q 2hr X 2 then Q 4hr X 2, if remains yellow, continue Q 4hrs  Escalate  MEWS: Escalate Yellow: discuss with charge nurse/RN and consider discussing with provider and RRT  Notify: Charge Nurse/RN  Name of Charge Nurse/RN Notified Clint Lipps RN  Date Charge Nurse/RN Notified 09/29/20  Time Charge Nurse/RN Notified [place]  Document  Patient Outcome Other (Comment) (stable on unit)  Progress note created (see row info) Yes

## 2020-09-29 NOTE — Progress Notes (Signed)
Pt is refusing this nurse to be her caregiver tonight due to trying to fix the equipment malfunction of her machine. Charge nurse aware.

## 2020-09-30 DIAGNOSIS — I350 Nonrheumatic aortic (valve) stenosis: Secondary | ICD-10-CM | POA: Diagnosis not present

## 2020-09-30 DIAGNOSIS — I2510 Atherosclerotic heart disease of native coronary artery without angina pectoris: Secondary | ICD-10-CM | POA: Diagnosis not present

## 2020-09-30 DIAGNOSIS — N1832 Chronic kidney disease, stage 3b: Secondary | ICD-10-CM | POA: Diagnosis not present

## 2020-09-30 DIAGNOSIS — I5023 Acute on chronic systolic (congestive) heart failure: Secondary | ICD-10-CM | POA: Diagnosis not present

## 2020-09-30 LAB — PREPARE RBC (CROSSMATCH)

## 2020-09-30 LAB — CBC
HCT: 22.8 % — ABNORMAL LOW (ref 36.0–46.0)
Hemoglobin: 7 g/dL — ABNORMAL LOW (ref 12.0–15.0)
MCH: 29 pg (ref 26.0–34.0)
MCHC: 30.7 g/dL (ref 30.0–36.0)
MCV: 94.6 fL (ref 80.0–100.0)
Platelets: 404 10*3/uL — ABNORMAL HIGH (ref 150–400)
RBC: 2.41 MIL/uL — ABNORMAL LOW (ref 3.87–5.11)
RDW: 15 % (ref 11.5–15.5)
WBC: 8 10*3/uL (ref 4.0–10.5)
nRBC: 0 % (ref 0.0–0.2)

## 2020-09-30 LAB — HEMOGLOBIN AND HEMATOCRIT, BLOOD
HCT: 26.1 % — ABNORMAL LOW (ref 36.0–46.0)
Hemoglobin: 8.4 g/dL — ABNORMAL LOW (ref 12.0–15.0)

## 2020-09-30 LAB — BASIC METABOLIC PANEL
Anion gap: 10 (ref 5–15)
BUN: 47 mg/dL — ABNORMAL HIGH (ref 8–23)
CO2: 23 mmol/L (ref 22–32)
Calcium: 8.2 mg/dL — ABNORMAL LOW (ref 8.9–10.3)
Chloride: 103 mmol/L (ref 98–111)
Creatinine, Ser: 2.37 mg/dL — ABNORMAL HIGH (ref 0.44–1.00)
GFR, Estimated: 22 mL/min — ABNORMAL LOW (ref >60)
Glucose, Bld: 230 mg/dL — ABNORMAL HIGH (ref 70–99)
Potassium: 3.9 mmol/L (ref 3.5–5.1)
Sodium: 136 mmol/L (ref 135–145)

## 2020-09-30 LAB — ABO/RH: ABO/RH(D): O POS

## 2020-09-30 LAB — GLUCOSE, CAPILLARY
Glucose-Capillary: 247 mg/dL — ABNORMAL HIGH (ref 70–99)
Glucose-Capillary: 192 mg/dL — ABNORMAL HIGH (ref 70–99)
Glucose-Capillary: 294 mg/dL — ABNORMAL HIGH (ref 70–99)
Glucose-Capillary: 143 mg/dL — ABNORMAL HIGH (ref 70–99)

## 2020-09-30 LAB — COOXEMETRY PANEL
Carboxyhemoglobin: 2 % — ABNORMAL HIGH (ref 0.5–1.5)
Methemoglobin: 1.1 % (ref 0.0–1.5)
O2 Saturation: 55.3 %
Total hemoglobin: 7.3 g/dL — ABNORMAL LOW (ref 12.0–16.0)

## 2020-09-30 MED ORDER — LORAZEPAM 1 MG PO TABS
1.0000 mg | ORAL_TABLET | Freq: Three times a day (TID) | ORAL | Status: DC | PRN
  Administered 2020-09-30 – 2020-10-01 (×2): 1 mg via ORAL
  Filled 2020-09-30 (×2): qty 1

## 2020-09-30 MED ORDER — TRAZODONE HCL 50 MG PO TABS
50.0000 mg | ORAL_TABLET | Freq: Every evening | ORAL | Status: DC | PRN
  Administered 2020-09-30 – 2020-10-01 (×2): 50 mg via ORAL
  Filled 2020-09-30 (×2): qty 1

## 2020-09-30 MED ORDER — SODIUM CHLORIDE 0.9% IV SOLUTION: Freq: Once | INTRAVENOUS | Status: AC

## 2020-09-30 NOTE — Progress Notes (Signed)
5 Days Post-Op Procedure(s) (LRB): RIGHT/LEFT HEART CATH AND CORONARY ANGIOGRAPHY (N/A) Subjective: Reports some shortness of breath at times. Says she did ambulate yesterday. Not eating much.  Objective: Vital signs in last 24 hours: Temp:  [98 F (36.7 C)-98.9 F (37.2 C)] 98.5 F (36.9 C) (03/23 0838) Pulse Rate:  [108-124] 119 (03/23 0838) Cardiac Rhythm: Sinus tachycardia (03/22 1900) Resp:  [16-20] 16 (03/23 0838) BP: (106-117)/(60-65) 106/65 (03/23 0838) SpO2:  [95 %-100 %] 100 % (03/23 0838) Weight:  [61.5 kg] 61.5 kg (03/23 0500)  Hemodynamic parameters for last 24 hours: CVP:  [6 mmHg-10 mmHg] 6 mmHg  Intake/Output from previous day: No intake/output data recorded. Intake/Output this shift: No intake/output data recorded.  General appearance: alert and cooperative Neurologic: intact Heart: regular rate and rhythm and systolic murmur RSB Lungs: diminished breath sounds bibasilar Extremities: no edema  Lab Results: Recent Labs    09/29/20 0439 09/30/20 0518  WBC 8.3 8.0  HGB 7.9* 7.0*  HCT 25.3* 22.8*  PLT 398 404*   BMET:  Recent Labs    09/29/20 0439 09/30/20 0518  NA 137 136  K 3.7 3.9  CL 103 103  CO2 24 23  GLUCOSE 154* 230*  BUN 44* 47*  CREATININE 2.21* 2.37*  CALCIUM 8.3* 8.2*    PT/INR: No results for input(s): LABPROT, INR in the last 72 hours. ABG    Component Value Date/Time   HCO3 18.2 (L) 09/10/2020 0948   TCO2 19 (L) 10/02/2020 0948   ACIDBASEDEF 6.0 (H) 09/24/2020 0948   O2SAT 55.3 09/30/2020 0505   CBG (last 3)  Recent Labs    09/29/20 1628 09/29/20 2119 09/30/20 0742  GLUCAP 346* 218* 247*    Assessment/Plan:  Severe multivessel CAD s/p NSTEMI Moderate to severe aortic stenosis. I suspect this is low gradient, low EF severe AS with low SVI of 25. Severe LV systolic dysfunction Stage IV CKD with creat 1.9 on admission. Increased to 2.5 with diuresis, started decreasing and now going back up to 2.37 today. Severe  malnutrition with albumin 1.8, prealbumin 15. Acute on chronic anemia with Hgb 7.0 today. Ongoing smoking with likely COPD.  CABG/AVR is the best treatment for her heart disease but obviously high risk due to the risk factors noted above. I think there is a high risk of needing CRRT postop and possibly permanent dialysis. I have tentatively scheduled her for Friday depending on creat tomorrow. She has been seen by dentistry and not felt to have any acute issues that need to be treated preop. I discussed the high risk nature of this surgery with her including alternatives, benefits and risks; including but not limited to bleeding, blood transfusion, infection, stroke, myocardial infarction, graft failure, heart block requiring a permanent pacemaker, organ dysfunction, and death.  Ronaldo Miyamoto understands and agrees to proceed.  Will follow up on renal function tomorrow. I don't want to take her to the OR with a rising creatinine.   LOS: 10 days    Gaye Pollack 09/30/2020

## 2020-09-30 NOTE — Progress Notes (Signed)
Progress Note  Patient Name: Sara [name] Date of Encounter: 09/30/2020  Primary Cardiologist: Dr. Eleonore Chiquito, MD   Subjective   No specific complaints today. Anticipating CABG/AVR 09/19/2020   Inpatient Medications    Scheduled Meds: . aspirin EC  81 mg Oral Daily  . atorvastatin  80 mg Oral Daily  . Chlorhexidine Gluconate Cloth  6 each Topical Daily  . enoxaparin (LOVENOX) injection  30 mg Subcutaneous Q24H  . ezetimibe  10 mg Oral Daily  . feeding supplement (GLUCERNA SHAKE)  237 mL Oral BID BM  . hydrALAZINE  25 mg Oral Q8H  . insulin aspart  0-15 Units Subcutaneous TID WC  . insulin aspart  0-5 Units Subcutaneous QHS  . insulin aspart  3 Units Subcutaneous TID WC  . insulin glargine  12 Units Subcutaneous Daily  . isosorbide mononitrate  30 mg Oral Daily  . polyethylene glycol  17 g Oral Daily   Continuous Infusions: . sodium chloride 250 mL (09/28/20 0133)  . milrinone 0.25 mcg/kg/min (09/29/20 1933)   PRN Meds: sodium chloride, acetaminophen, ondansetron (ZOFRAN) IV, sodium chloride flush, traZODone   Vital Signs    Vitals:   09/29/20 1827 09/30/20 0038 09/30/20 0500 09/30/20 0640  BP: 113/60 116/62  106/65  Pulse: (!) 124 (!) 110  (!) 110  Resp: 20 17  16   Temp: 98.9 F (37.2 C) 98.9 F (37.2 C)  98 F (36.7 C)  TempSrc: Oral Oral  Oral  SpO2: 98% 95%  95%  Weight:   61.5 kg   Height:       No intake or output data in the 24 hours ending 09/30/20 0745 Filed Weights   09/28/20 0557 09/29/20 0445 09/30/20 0500  Weight: 62.2 kg 62.6 kg 61.5 kg    Physical Exam   General: Well developed, well nourished, NAD Neck: Negative for carotid bruits. No JVD Lungs:Clear to ausculation bilaterally. No wheezes, rales, or rhonchi. Breathing is unlabored. Cardiovascular: RRR with S1 S2. + murmur Abdomen: Soft, non-tender, non-distended. No obvious abdominal masses. Extremities: No edema. Radial pulses 2+ bilaterally Neuro: Alert and oriented. No focal  deficits. No facial asymmetry. MAE spontaneously. Psych: Responds to questions appropriately with normal affect.    Labs    Chemistry Recent Labs  Lab 09/28/20 0500 09/29/20 0439 09/30/20 0518  NA 136 137 136  K 3.6 3.7 3.9  CL 104 103 103  CO2 24 24 23   GLUCOSE 171* 154* 230*  BUN 48* 44* 47*  CREATININE 2.06* 2.21* 2.37*  CALCIUM 7.8* 8.3* 8.2*  PROT 4.9*  --   --   ALBUMIN 1.8*  --   --   AST 26  --   --   ALT 15  --   --   ALKPHOS 78  --   --   BILITOT 0.9  --   --   GFRNONAA 26* 24* 22*  ANIONGAP 8 10 10      Hematology Recent Labs  Lab 09/27/20 0617 09/28/20 0500 09/29/20 0439 09/30/20 0518  WBC 9.3  --  8.3 8.0  RBC 2.53*  --  2.71* 2.41*  HGB 7.4* 7.7* 7.9* 7.0*  HCT 23.5* 24.7* 25.3* 22.8*  MCV 92.9  --  93.4 94.6  MCH 29.2  --  29.2 29.0  MCHC 31.5  --  31.2 30.7  RDW 14.3  --  14.6 15.0  PLT 381  --  398 404*    Cardiac EnzymesNo results for input(s): TROPONINI in the last 168  hours. No results for input(s): TROPIPOC in the last 168 hours.   BNPNo results for input(s): BNP, PROBNP in the last 168 hours.   DDimer No results for input(s): DDIMER in the last 168 hours.   Radiology    VAS US DOPPLER PRE CABG  Result Date: 09/29/2020 PREOPERATIVE VASCULAR EVALUATION  Indications:           Pre-CABG. Risk Factors:          Hypertension, Diabetes, coronary artery disease, prior                        CVA. Vascular               Left CEA approx 3 yrs ago per patient. Interventions: Comparison Study:      No previous here Performing Technologist: Vonzell Schlatter RVT  Examination Guidelines: A complete evaluation includes B-mode imaging, spectral Doppler, color Doppler, and power Doppler as needed of all accessible portions of each vessel. Bilateral testing is considered an integral part of a complete examination. Limited examinations for reoccurring indications may be performed as noted.  Right Carotid Findings:  +----------+--------+--------+--------+------------+--------+           PSV cm/sEDV cm/sStenosisDescribe    Comments +----------+--------+--------+--------+------------+--------+ CCA Prox  99      17                                   +----------+--------+--------+--------+------------+--------+ CCA Distal113     23                                   +----------+--------+--------+--------+------------+--------+ ICA Prox  126     26      40-59%  heterogenous         +----------+--------+--------+--------+------------+--------+ ICA Distal66      19                                   +----------+--------+--------+--------+------------+--------+ ECA       137     13                                   +----------+--------+--------+--------+------------+--------+ Portions of this table do not appear on this page. +----------+--------+-------+--------+------------+           PSV cm/sEDV cmsDescribeArm Pressure +----------+--------+-------+--------+------------+ Subclavian175                                 +----------+--------+-------+--------+------------+ +---------+--------+--+--------+--+ VertebralPSV cm/s63EDV cm/s19 +---------+--------+--+--------+--+ Left Carotid Findings: +----------+--------+--------+--------+-----------+--------+           PSV cm/sEDV cm/sStenosisDescribe   Comments +----------+--------+--------+--------+-----------+--------+ CCA Prox  119     17                                  +----------+--------+--------+--------+-----------+--------+ CCA Distal131     27                                  +----------+--------+--------+--------+-----------+--------+ ICA Prox  93  23      1-39%   homogeneous         +----------+--------+--------+--------+-----------+--------+ ICA Distal103     36                                  +----------+--------+--------+--------+-----------+--------+ ECA       376     48       >50%                        +----------+--------+--------+--------+-----------+--------+ +----------+--------+--------+--------+------------+ SubclavianPSV cm/sEDV cm/sDescribeArm Pressure +----------+--------+--------+--------+------------+           172                                  +----------+--------+--------+--------+------------+ +---------+--------+---+--------+--+ VertebralPSV cm/s123EDV cm/s42 +---------+--------+---+--------+--+  ABI Findings: +---------+-----------------+-----+------------------+------------------------+ Right    Rt Pressure      IndexWaveform          Comment                           (mmHg)                                                           +---------+-----------------+-----+------------------+------------------------+ Brachial                       triphasic         PICC no pressures                                                         allowed                  +---------+-----------------+-----+------------------+------------------------+ PTA      40               0.37 dampened                                                                  monophasic                                 +---------+-----------------+-----+------------------+------------------------+ DP       40               0.37 dampened                                                                  monophasic                                 +---------+-----------------+-----+------------------+------------------------+  Great Toe0                0.00 Absent                                     +---------+-----------------+-----+------------------+------------------------+ +--------+------------------+-----+----------+-------+ Left    Lt Pressure (mmHg)IndexWaveform  Comment +--------+------------------+-----+----------+-------+ [id number]                    triphasic          +--------+------------------+-----+----------+-------+ PTA     62                0.58 monophasic        +--------+------------------+-----+----------+-------+ DP      78                0.73 monophasic        +--------+------------------+-----+----------+-------+ +-------+---------------+----------------+ ABI/TBIToday's ABI/TBIPrevious ABI/TBI +-------+---------------+----------------+ Right  .37                             +-------+---------------+----------------+ Left   .73                             +-------+---------------+----------------+  Right Doppler Findings: +-----------+--------+-----+---------+-------------------------+ Site       PressureIndexDoppler  Comments                  +-----------+--------+-----+---------+-------------------------+ Brachial                triphasicPICC no pressures allowed +-----------+--------+-----+---------+-------------------------+ Radial                  triphasic                          +-----------+--------+-----+---------+-------------------------+ Ulnar                   triphasic                          +-----------+--------+-----+---------+-------------------------+ Palmar Arch             triphasic                          +-----------+--------+-----+---------+-------------------------+  Left Doppler Findings: +-----------+--------+-----+---------+--------+ Site       PressureIndexDoppler  Comments +-----------+--------+-----+---------+--------+ Brachial   107          triphasic         +-----------+--------+-----+---------+--------+ Radial                  triphasic         +-----------+--------+-----+---------+--------+ Ulnar                   triphasic         +-----------+--------+-----+---------+--------+ Palmar Arch             triphasic         +-----------+--------+-----+---------+--------+  Summary: Right Carotid: Velocities in the right ICA are consistent with a 40-59%                 stenosis. Left Carotid: Velocities in the left ICA are consistent with a 1-39% stenosis.               The ECA appears >50% stenosed.  Vertebrals:  Bilateral vertebral arteries demonstrate antegrade flow. Subclavians: Normal flow hemodynamics were seen in bilateral subclavian              arteries. Right ABI: Resting right ankle-brachial index indicates severe right lower extremity arterial disease. The right toe-brachial index is abnormal. Left ABI: Resting left ankle-brachial index indicates moderate left lower extremity arterial disease. The left toe-brachial index is abnormal. Right Upper Extremity: Doppler waveforms decrease >50% with right radial compression. Doppler waveform obliterate with right ulnar compression. Left Upper Extremity: Doppler waveform obliterate with left radial compression. Doppler waveform obliterate with left ulnar compression.  Electronically signed by Deitra Mayo MD on 09/29/2020 at 3:46:45 PM.    Final    VAS [place] LOWER EXTREMITY VENOUS (DVT)  Result Date: 09/29/2020  Lower Venous DVT Study Indications: Pain.  Risk Factors: None identified. Anticoagulation: Lovenox. Comparison Study: No previous Performing Technologist: Vonzell Schlatter RVT  Examination Guidelines: A complete evaluation includes B-mode imaging, spectral Doppler, color Doppler, and power Doppler as needed of all accessible portions of each vessel. Bilateral testing is considered an integral part of a complete examination. Limited examinations for reoccurring indications may be performed as noted. The reflux portion of the exam is performed with the patient in reverse Trendelenburg.  +---------+---------------+---------+-----------+----------+--------------+ RIGHT    CompressibilityPhasicitySpontaneityPropertiesThrombus Aging +---------+---------------+---------+-----------+----------+--------------+ CFV      Full           Yes      Yes                                  +---------+---------------+---------+-----------+----------+--------------+ SFJ      Full                                                        +---------+---------------+---------+-----------+----------+--------------+ FV Prox  Full                                                        +---------+---------------+---------+-----------+----------+--------------+ FV Mid   Full                                                        +---------+---------------+---------+-----------+----------+--------------+ FV DistalFull                                                        +---------+---------------+---------+-----------+----------+--------------+ PFV      Full                                                        +---------+---------------+---------+-----------+----------+--------------+ POP      Full  Yes      Yes                                 +---------+---------------+---------+-----------+----------+--------------+ PTV      Full                                                        +---------+---------------+---------+-----------+----------+--------------+ PERO     Full                                                        +---------+---------------+---------+-----------+----------+--------------+   Summary: RIGHT: - There is no evidence of deep vein thrombosis in the lower extremity. - There is no evidence of superficial venous thrombosis.  - No cystic structure found in the popliteal fossa.   *See table(s) above for measurements and observations. Electronically signed by Deitra Mayo MD on 09/29/2020 at 3:45:58 PM.    Final    Telemetry    09/30/20 NSR/ST- Personally Reviewed  ECG    No new tracing as of 09/30/20 - Personally Reviewed  Cardiac Studies   Cardiac Cath: 09/24/2020  Ost LAD lesion is 35% stenosed.  Prox LAD to Mid LAD lesion is 95% stenosed.  Lat 1st Diag lesion is 90% stenosed.  1st Diag-1 lesion is 90%  stenosed.  1st Diag-2 lesion is 80% stenosed.  Prox Cx lesion is 75% stenosed.  1st Mrg lesion is 40% stenosed.  Mid Cx to Dist Cx lesion is 75% stenosed.  Prox RCA to Mid RCA lesion is 80% stenosed.  Dist RCA lesion is 50% stenosed.  RPDA lesion is 80% stenosed.  1st RPL lesion is 80% stenosed.  1. Severe three-vessel coronary artery disease:  Severe stenosis of the mid LAD/first diagonal with complex calcific disease  Severe stenosis of the proximal and distal circumflex  Severe stenosis of the mid RCA and the PDA/PLA  2. Hemodynamic findings consistent with probable moderate aortic stenosis, possibly with low flow component in the setting of severe LV dysfunction. Peak to peak gradient 16 mmHg, mean gradient 9 mmHg. Aortic valve crossed with a J-wire.  3. Preserved cardiac output on milrinone with cardiac index 5 L/min/m, low right heart filling pressures, LVEDP 19 mmHg.  RA 2 RV 25/1 PA 29/10 mean 20 Pulmonary capillary wedge pressure 12 LVEDP 19  PA oxygen saturation 73% Ao oxygen saturation 97%  Cardiac output 8.24 L/min Cardiac index 5.0 L/min/m  Recommend: cardiac surgical evaluation for CABG/AVR. High risk candidate. Multidisciplinary heart team review.  Diagnostic Dominance: Right     Echo: 09/21/2020 1. Left ventricular ejection fraction, by estimation, is 25 to 30%. The  left ventricle has severely decreased function. The left ventricle  demonstrates regional wall motion abnormalities (see scoring  diagram/findings for description). The left  ventricular internal cavity size was mildly dilated. There is mild left  ventricular hypertrophy. Left ventricular diastolic parameters are  consistent with Grade II diastolic dysfunction (pseudonormalization).  There is severe akinesis of the left  ventricular, mid-apical anteroseptal wall and apical segment. There is  severe akinesis of the left ventricular, entire inferior wall.  2.  Right ventricular systolic function is normal. The right ventricular  size is normal. There is mildly elevated pulmonary artery systolic  pressure.  3. Left atrial size was mildly dilated.  4. The mitral valve is grossly normal. Trivial mitral valve  regurgitation.  5. The aortic valve is calcified. There is moderate calcification of the  aortic valve. There is moderate thickening of the aortic valve. Aortic  valve regurgitation is trivial. Mild aortic valve stenosis.  Aortic valve mean gradient measures 11.8 mmHg. Aortic  valve peak gradient measures 20.6 mmHg. Aortic valve area, by VTI measures  0.89 cm.   Echocardiogram 01/26/2020 (Care Everywhere): 1. Mild LV dysfunction. EF is in the 40-45% range. The pattern is  worrisome for CAD.  2. Mild calcific aortic stenosis. Gradients are 20/11 mmHg.  3. Mild LVH _______________  Left Cardiac Catheterization 02/04/2020 (Care Everywhere): Mid LAD 70% stenosis, distal LCx 70% stenosis, mid RCA 80% stenosis, proximal RPDA 50% stenosis, left main unremarkable. _______________  Cardiac MRI 02/06/2020 (Care Everywhere): LVEF 51%, RVEF 67%, LV scar 4%, small subendocardial infarction of basal anterior/mid anterolateral wall (LAD or ramus), small subendocardial infarction of mid inferoseptum (RCA), dysfunctional myocardium in all 3 coronary artery distributions predominantly viable, mild-moderate AS (AVA 1.0cm2, peak gradient 92mmHg) Films in Epic now  Patient Profile     70 y.o. female with a history of three vessel CAD noted on cardiac catheterization in 01/2020, ischemic cardiomyopathy/chronic systolic CHF, stroke, hypertension, diabetes mellitus, CKD III-IV,admitted 03/59for CHF andNSTEMI  Assessment & Plan    1. Acute on chronic CHF with ischemic cardiomyopathy: -Echo performed 09/24/20 with LVEF at 25-30% with mildly dilated ventricle, severely calcified aortic valve with AVA 0.87 cm2 and DVI 0.31 consistent with moderate to  severe aortic stenosis>>she has known multi vessel CAD with prior plan for CABG while in [place] however this was never performed. LHC 3/18/2022showed severe CAD and at least moderate aortic stenosis, she ultimately needs CABG with AVR. Evaluated by Dr. Cyndia Bent with plans for CABG with AVR 09/10/2020 -Continue milrinone 0.44mcg/kg/hr -Co-ox>>73>>65>>55 today>>plan per notes yesterday was to consider transfusion if Co-ox continues to dip however patient no clear she would like a transfusion at this time  -Continue Imdur and hydralazine -Cr, 2.37 today>>up from yesterday  -Unable to start ACE/ARN/ARNI due to renal dysfunction   2. Moderate to severe AS: -Echo as above with LVEF at 25-30% with mildly dilated ventricle, severely calcified aortic valve with AVA 0.87 cm2 and DVI 0.31 consistent with moderate to severe aortic stenosis with plans for CABG/AVR with Dr. Cyndia Bent 10/02/2020 -Suspect low flow/low gradient severe AS  3. NSTEMI with known multivessel CAD: -Found to have 3VD 01/2020>>s/p cMRI with viable myocardium with workup for CABG in [place] however never performed -HsT peak at 2156 this admission>>no recurrent chest pain  -Continue ASA, statin and Zetia  -Plan for CABG/AVR 3/25  4. HTN: -Stable, 106/65>>116/62>>113/60 -Continue current regimen   5. HLD: -LDL noted to be 177 on labs this admission  -On PTA Pravastatin and Zetia>>transitioned to Atorvastatin 80 QD -Repeat panel in 6-8 weeks with LFT  6. Acute on chronic kidney disease stage IV: -Cr, 2.37 today -Unable to use ACEI/ARB/ARNI due to poor renal function   7. PVC/ventricular bigeminy: -Intermittent PVCs however not as frequent>>ST on telemetry    Signed, Kathyrn Drown NP-C [facility] Pager: [phone] 09/30/2020, 7:45 AM     For questions or updates, please contact   Please consult [email or web address] for contact info under Cardiology/STEMI.

## 2020-09-30 NOTE — Progress Notes (Signed)
[id number] Pt declined walking at this time. RN and I stressed importance of mobility and IS. Encouraged pt to use IS 10x an hour. Pt's HR 118-120 ST lying in bed. Will continue to follow pt. Graylon Good RN BSN 09/30/2020 9:24 AM

## 2020-09-30 NOTE — Progress Notes (Signed)
Inpatient Diabetes Program Recommendations  AACE/ADA: New Consensus Statement on Inpatient Glycemic Control (2015)  Target Ranges:  Prepandial:   less than 140 mg/dL      Peak postprandial:   less than 180 mg/dL (1-2 hours)      Critically ill patients:  140 - 180 mg/dL   Lab Results  Component Value Date   GLUCAP 247 (H) 09/30/2020   HGBA1C 9.3 (H) 09/17/2020    Review of Glycemic Control Results for Sara [name], Sara [name] (MRN [id number]) as of 09/30/2020 11:44  Ref. Range 09/29/2020 08:10 09/29/2020 12:10 09/29/2020 16:28 09/29/2020 21:19 09/30/2020 07:42  Glucose-Capillary Latest Ref Range: 70 - 99 mg/dL 170 (H) 125 (H) 346 (H) 218 (H) 247 (H)    Diabetes history: DM 2 Outpatient Diabetes medications: Glucotrol XL 10 mg bid, Janumet XR [phone] mg daily Current orders for Inpatient glycemic control:  Novolog moderate tid with meals and HS Novolog 3 units tid with meals Lantus 12 units  Inpatient Diabetes Program Recommendations:   -  Consider increasing Lantus to 14 units -  Increase Novolog meal coverage to 4 units tid if eating >50% -  Reduce Novolog Correction to "sensitive" 0-9 units tid due to renal function   Thanks,  Tama Headings RN, MSN, BC-ADM Inpatient Diabetes Coordinator Team Pager [phone] ([id number])

## 2020-10-01 ENCOUNTER — Inpatient Hospital Stay (HOSPITAL_COMMUNITY): Payer: Medicare Other

## 2020-10-01 DIAGNOSIS — E872 Acidosis: Secondary | ICD-10-CM

## 2020-10-01 DIAGNOSIS — R570 Cardiogenic shock: Secondary | ICD-10-CM | POA: Diagnosis not present

## 2020-10-01 DIAGNOSIS — I739 Peripheral vascular disease, unspecified: Secondary | ICD-10-CM

## 2020-10-01 DIAGNOSIS — E1159 Type 2 diabetes mellitus with other circulatory complications: Secondary | ICD-10-CM | POA: Diagnosis not present

## 2020-10-01 DIAGNOSIS — Z01818 Encounter for other preprocedural examination: Secondary | ICD-10-CM

## 2020-10-01 DIAGNOSIS — J9601 Acute respiratory failure with hypoxia: Secondary | ICD-10-CM

## 2020-10-01 DIAGNOSIS — I5023 Acute on chronic systolic (congestive) heart failure: Secondary | ICD-10-CM | POA: Diagnosis not present

## 2020-10-01 DIAGNOSIS — I2510 Atherosclerotic heart disease of native coronary artery without angina pectoris: Secondary | ICD-10-CM | POA: Diagnosis not present

## 2020-10-01 LAB — TYPE AND SCREEN
ABO/RH(D): O POS
Antibody Screen: NEGATIVE
Unit division: 0

## 2020-10-01 LAB — CBC
HCT: 26.3 % — ABNORMAL LOW (ref 36.0–46.0)
HCT: 27.2 % — ABNORMAL LOW (ref 36.0–46.0)
Hemoglobin: 8.5 g/dL — ABNORMAL LOW (ref 12.0–15.0)
Hemoglobin: 8.2 g/dL — ABNORMAL LOW (ref 12.0–15.0)
MCH: 28.8 pg (ref 26.0–34.0)
MCH: 28.4 pg (ref 26.0–34.0)
MCHC: 32.3 g/dL (ref 30.0–36.0)
MCHC: 30.1 g/dL (ref 30.0–36.0)
MCV: 89.2 fL (ref 80.0–100.0)
MCV: 94.1 fL (ref 80.0–100.0)
Platelets: 451 10*3/uL — ABNORMAL HIGH (ref 150–400)
Platelets: 501 10*3/uL — ABNORMAL HIGH (ref 150–400)
RBC: 2.95 MIL/uL — ABNORMAL LOW (ref 3.87–5.11)
RBC: 2.89 MIL/uL — ABNORMAL LOW (ref 3.87–5.11)
RDW: 18.7 % — ABNORMAL HIGH (ref 11.5–15.5)
RDW: 18.8 % — ABNORMAL HIGH (ref 11.5–15.5)
WBC: 10.2 10*3/uL (ref 4.0–10.5)
WBC: 11.6 10*3/uL — ABNORMAL HIGH (ref 4.0–10.5)
nRBC: 0.3 % — ABNORMAL HIGH (ref 0.0–0.2)
nRBC: 0.4 % — ABNORMAL HIGH (ref 0.0–0.2)

## 2020-10-01 LAB — LACTIC ACID, PLASMA
Lactic Acid, Venous: 9.2 mmol/L (ref 0.5–1.9)
Lactic Acid, Venous: 9.4 mmol/L (ref 0.5–1.9)
Lactic Acid, Venous: 4.5 mmol/L (ref 0.5–1.9)

## 2020-10-01 LAB — POCT I-STAT 7, (LYTES, BLD GAS, ICA,H+H)
Acid-base deficit: 13 mmol/L — ABNORMAL HIGH (ref 0.0–2.0)
Acid-base deficit: 8 mmol/L — ABNORMAL HIGH (ref 0.0–2.0)
Bicarbonate: 11.1 mmol/L — ABNORMAL LOW (ref 20.0–28.0)
Bicarbonate: 16.8 mmol/L — ABNORMAL LOW (ref 20.0–28.0)
Calcium, Ion: 1.1 mmol/L — ABNORMAL LOW (ref 1.15–1.40)
Calcium, Ion: 1.11 mmol/L — ABNORMAL LOW (ref 1.15–1.40)
HCT: 24 % — ABNORMAL LOW (ref 36.0–46.0)
HCT: 28 % — ABNORMAL LOW (ref 36.0–46.0)
Hemoglobin: 8.2 g/dL — ABNORMAL LOW (ref 12.0–15.0)
Hemoglobin: 9.5 g/dL — ABNORMAL LOW (ref 12.0–15.0)
O2 Saturation: 99 %
O2 Saturation: 98 %
Patient temperature: 98.7
Patient temperature: 97.7
Potassium: 4.5 mmol/L (ref 3.5–5.1)
Potassium: 4 mmol/L (ref 3.5–5.1)
Sodium: 132 mmol/L — ABNORMAL LOW (ref 135–145)
Sodium: 135 mmol/L (ref 135–145)
TCO2: 12 mmol/L — ABNORMAL LOW (ref 22–32)
TCO2: 18 mmol/L — ABNORMAL LOW (ref 22–32)
pCO2 arterial: 21.3 mmHg — ABNORMAL LOW (ref 32.0–48.0)
pCO2 arterial: 29.5 mmHg — ABNORMAL LOW (ref 32.0–48.0)
pH, Arterial: 7.327 — ABNORMAL LOW (ref 7.350–7.450)
pH, Arterial: 7.361 (ref 7.350–7.450)
pO2, Arterial: 122 mmHg — ABNORMAL HIGH (ref 83.0–108.0)
pO2, Arterial: 110 mmHg — ABNORMAL HIGH (ref 83.0–108.0)

## 2020-10-01 LAB — BASIC METABOLIC PANEL
Anion gap: 14 (ref 5–15)
Anion gap: 16 — ABNORMAL HIGH (ref 5–15)
Anion gap: 11 (ref 5–15)
BUN: 50 mg/dL — ABNORMAL HIGH (ref 8–23)
BUN: 55 mg/dL — ABNORMAL HIGH (ref 8–23)
BUN: 56 mg/dL — ABNORMAL HIGH (ref 8–23)
CO2: 18 mmol/L — ABNORMAL LOW (ref 22–32)
CO2: 21 mmol/L — ABNORMAL LOW (ref 22–32)
CO2: 23 mmol/L (ref 22–32)
Calcium: 8.5 mg/dL — ABNORMAL LOW (ref 8.9–10.3)
Calcium: 8 mg/dL — ABNORMAL LOW (ref 8.9–10.3)
Calcium: 8.4 mg/dL — ABNORMAL LOW (ref 8.9–10.3)
Chloride: 103 mmol/L (ref 98–111)
Chloride: 99 mmol/L (ref 98–111)
Chloride: 103 mmol/L (ref 98–111)
Creatinine, Ser: 2.65 mg/dL — ABNORMAL HIGH (ref 0.44–1.00)
Creatinine, Ser: 2.85 mg/dL — ABNORMAL HIGH (ref 0.44–1.00)
Creatinine, Ser: 2.82 mg/dL — ABNORMAL HIGH (ref 0.44–1.00)
GFR, Estimated: 19 mL/min — ABNORMAL LOW (ref >60)
GFR, Estimated: 17 mL/min — ABNORMAL LOW (ref >60)
GFR, Estimated: 18 mL/min — ABNORMAL LOW (ref >60)
Glucose, Bld: 251 mg/dL — ABNORMAL HIGH (ref 70–99)
Glucose, Bld: 412 mg/dL — ABNORMAL HIGH (ref 70–99)
Glucose, Bld: 49 mg/dL — ABNORMAL LOW (ref 70–99)
Potassium: 4.5 mmol/L (ref 3.5–5.1)
Potassium: 4.4 mmol/L (ref 3.5–5.1)
Potassium: 3.8 mmol/L (ref 3.5–5.1)
Sodium: 135 mmol/L (ref 135–145)
Sodium: 136 mmol/L (ref 135–145)
Sodium: 137 mmol/L (ref 135–145)

## 2020-10-01 LAB — PROCALCITONIN: Procalcitonin: <0.1 ng/mL

## 2020-10-01 LAB — COOXEMETRY PANEL
Carboxyhemoglobin: 1.7 % — ABNORMAL HIGH (ref 0.5–1.5)
Carboxyhemoglobin: 1.4 % (ref 0.5–1.5)
Carboxyhemoglobin: 1.1 % (ref 0.5–1.5)
Carboxyhemoglobin: 1.8 % — ABNORMAL HIGH (ref 0.5–1.5)
Methemoglobin: 1 % (ref 0.0–1.5)
Methemoglobin: 1.1 % (ref 0.0–1.5)
Methemoglobin: 0.9 % (ref 0.0–1.5)
Methemoglobin: 1.3 % (ref 0.0–1.5)
O2 Saturation: 54.8 %
O2 Saturation: 68.5 %
O2 Saturation: 77.4 %
O2 Saturation: 75.9 %
Total hemoglobin: 8.7 g/dL — ABNORMAL LOW (ref 12.0–16.0)
Total hemoglobin: 8.1 g/dL — ABNORMAL LOW (ref 12.0–16.0)
Total hemoglobin: 8.7 g/dL — ABNORMAL LOW (ref 12.0–16.0)
Total hemoglobin: 7 g/dL — ABNORMAL LOW (ref 12.0–16.0)

## 2020-10-01 LAB — GLUCOSE, CAPILLARY
Glucose-Capillary: 282 mg/dL — ABNORMAL HIGH (ref 70–99)
Glucose-Capillary: 384 mg/dL — ABNORMAL HIGH (ref 70–99)
Glucose-Capillary: 161 mg/dL — ABNORMAL HIGH (ref 70–99)
Glucose-Capillary: 127 mg/dL — ABNORMAL HIGH (ref 70–99)
Glucose-Capillary: 41 mg/dL — CL (ref 70–99)
Glucose-Capillary: 98 mg/dL (ref 70–99)
Glucose-Capillary: 77 mg/dL (ref 70–99)

## 2020-10-01 LAB — BPAM RBC
Blood Product Expiration Date: 202204232359
ISSUE DATE / TIME: 202203231525
Unit Type and Rh: 5100

## 2020-10-01 LAB — BRAIN NATRIURETIC PEPTIDE: B Natriuretic Peptide: 3088.2 pg/mL — ABNORMAL HIGH (ref 0.0–100.0)

## 2020-10-01 LAB — MAGNESIUM: Magnesium: 2.3 mg/dL (ref 1.7–2.4)

## 2020-10-01 MED ORDER — INSULIN ASPART 100 UNIT/ML ~~LOC~~ SOLN
3.0000 [IU] | Freq: Four times a day (QID) | SUBCUTANEOUS | Status: DC
  Administered 2020-10-01 (×2): 3 [IU] via SUBCUTANEOUS

## 2020-10-01 MED ORDER — ATORVASTATIN CALCIUM 80 MG PO TABS
80.0000 mg | ORAL_TABLET | Freq: Every day | ORAL | Status: DC
  Administered 2020-10-01: 80 mg
  Filled 2020-10-01: qty 1

## 2020-10-01 MED ORDER — SODIUM BICARBONATE 8.4 % IV SOLN
50.0000 meq | INTRAVENOUS | Status: AC
  Administered 2020-10-01 (×2): 50 meq via INTRAVENOUS

## 2020-10-01 MED ORDER — ROCURONIUM BROMIDE 50 MG/5ML IV SOLN
100.0000 mg | Freq: Once | INTRAVENOUS | Status: AC
  Administered 2020-10-01: 50 mg via INTRAVENOUS
  Filled 2020-10-01: qty 10

## 2020-10-01 MED ORDER — MIDAZOLAM HCL 2 MG/2ML IJ SOLN
INTRAMUSCULAR | Status: AC
  Administered 2020-10-01: 2 mg via INTRAVENOUS
  Filled 2020-10-01: qty 2

## 2020-10-01 MED ORDER — DEXTROSE 50 % IV SOLN
25.0000 g | INTRAVENOUS | Status: AC
  Administered 2020-10-01: 25 g via INTRAVENOUS

## 2020-10-01 MED ORDER — ETOMIDATE 2 MG/ML IV SOLN: 20.0000 mg | Freq: Once | INTRAVENOUS | Status: AC

## 2020-10-01 MED ORDER — FENTANYL 2500MCG IN NS 250ML (10MCG/ML) PREMIX INFUSION
0.0000 ug/h | INTRAVENOUS | Status: DC
  Administered 2020-10-01: 25 ug/h via INTRAVENOUS
  Filled 2020-10-01: qty 250

## 2020-10-01 MED ORDER — FUROSEMIDE 10 MG/ML IJ SOLN
80.0000 mg | Freq: Once | INTRAMUSCULAR | Status: AC
  Administered 2020-10-01: 80 mg via INTRAVENOUS
  Filled 2020-10-01: qty 8

## 2020-10-01 MED ORDER — ASPIRIN 81 MG PO CHEW
81.0000 mg | CHEWABLE_TABLET | Freq: Every day | ORAL | Status: DC
  Administered 2020-10-01 – 2020-10-02 (×2): 81 mg
  Filled 2020-10-01 (×2): qty 1

## 2020-10-01 MED ORDER — MIDAZOLAM HCL 2 MG/2ML IJ SOLN: 2.0000 mg | Freq: Once | INTRAMUSCULAR | Status: AC

## 2020-10-01 MED ORDER — CHLORHEXIDINE GLUCONATE 0.12% ORAL RINSE (MEDLINE KIT)
15.0000 mL | Freq: Two times a day (BID) | OROMUCOSAL | Status: DC
  Administered 2020-10-01 – 2020-10-03 (×4): 15 mL via OROMUCOSAL

## 2020-10-01 MED ORDER — FENTANYL 2500MCG IN NS 250ML (10MCG/ML) PREMIX INFUSION: 25.0000 ug/h | INTRAVENOUS | Status: DC

## 2020-10-01 MED ORDER — DEXMEDETOMIDINE HCL IN NACL 400 MCG/100ML IV SOLN
0.4000 ug/kg/h | INTRAVENOUS | Status: DC
  Administered 2020-10-01: 0.4 ug/kg/h via INTRAVENOUS
  Filled 2020-10-01: qty 100

## 2020-10-01 MED ORDER — ETOMIDATE 2 MG/ML IV SOLN
INTRAVENOUS | Status: AC
  Administered 2020-10-01: 20 mg via INTRAVENOUS
  Filled 2020-10-01: qty 20

## 2020-10-01 MED ORDER — POLYETHYLENE GLYCOL 3350 17 G PO PACK
17.0000 g | PACK | Freq: Every day | ORAL | Status: DC
  Administered 2020-10-02: 17 g
  Filled 2020-10-01: qty 1

## 2020-10-01 MED ORDER — KETAMINE HCL 50 MG/5ML IJ SOSY
PREFILLED_SYRINGE | INTRAMUSCULAR | Status: AC
  Filled 2020-10-01: qty 5

## 2020-10-01 MED ORDER — DOCUSATE SODIUM 50 MG/5ML PO LIQD: 100.0000 mg | Freq: Two times a day (BID) | ORAL | Status: DC

## 2020-10-01 MED ORDER — DEXMEDETOMIDINE HCL IN NACL 400 MCG/100ML IV SOLN
0.0000 ug/kg/h | INTRAVENOUS | Status: DC
  Administered 2020-10-01 – 2020-10-02 (×2): 0.6 ug/kg/h via INTRAVENOUS
  Filled 2020-10-01 (×3): qty 100

## 2020-10-01 MED ORDER — FUROSEMIDE 10 MG/ML IJ SOLN
40.0000 mg | Freq: Once | INTRAMUSCULAR | Status: AC
  Administered 2020-10-01: 40 mg via INTRAVENOUS
  Filled 2020-10-01: qty 4

## 2020-10-01 MED ORDER — HYDRALAZINE HCL 25 MG PO TABS: 25.0000 mg | ORAL_TABLET | Freq: Three times a day (TID) | ORAL | Status: DC

## 2020-10-01 MED ORDER — ISOSORBIDE DINITRATE 10 MG PO TABS: 10.0000 mg | ORAL_TABLET | Freq: Three times a day (TID) | ORAL | Status: DC

## 2020-10-01 MED ORDER — FLUMAZENIL 0.5 MG/5ML IV SOLN
0.2000 mg | Freq: Once | INTRAVENOUS | Status: AC
  Administered 2020-10-01: 0.2 mg via INTRAVENOUS
  Filled 2020-10-01: qty 5

## 2020-10-01 MED ORDER — PANTOPRAZOLE SODIUM 40 MG IV SOLR
40.0000 mg | INTRAVENOUS | Status: DC
  Administered 2020-10-01 – 2020-10-02 (×2): 40 mg via INTRAVENOUS
  Filled 2020-10-01 (×2): qty 40

## 2020-10-01 MED ORDER — DEXTROSE 50 % IV SOLN
INTRAVENOUS | Status: AC
  Filled 2020-10-01: qty 50

## 2020-10-01 MED ORDER — INSULIN ASPART 100 UNIT/ML ~~LOC~~ SOLN
0.0000 [IU] | Freq: Four times a day (QID) | SUBCUTANEOUS | Status: DC
  Administered 2020-10-01: 15 [IU] via SUBCUTANEOUS
  Administered 2020-10-01 – 2020-10-02 (×4): 3 [IU] via SUBCUTANEOUS
  Administered 2020-10-03: 2 [IU] via SUBCUTANEOUS

## 2020-10-01 MED ORDER — FENTANYL CITRATE (PF) 100 MCG/2ML IJ SOLN: 100.0000 ug | Freq: Once | INTRAMUSCULAR | Status: AC

## 2020-10-01 MED ORDER — CEFTRIAXONE SODIUM 1 G IJ SOLR: 2.0000 g | INTRAMUSCULAR | Status: DC

## 2020-10-01 MED ORDER — ROCURONIUM BROMIDE 10 MG/ML (PF) SYRINGE
PREFILLED_SYRINGE | INTRAVENOUS | Status: AC
  Filled 2020-10-01: qty 10

## 2020-10-01 MED ORDER — NOREPINEPHRINE 4 MG/250ML-% IV SOLN
0.0000 ug/min | INTRAVENOUS | Status: DC
  Administered 2020-10-01: 20 ug/min via INTRAVENOUS
  Filled 2020-10-01: qty 250

## 2020-10-01 MED ORDER — FENTANYL CITRATE (PF) 100 MCG/2ML IJ SOLN: 25.0000 ug | Freq: Once | INTRAMUSCULAR | Status: DC

## 2020-10-01 MED ORDER — FENTANYL BOLUS VIA INFUSION
25.0000 ug | INTRAVENOUS | Status: DC | PRN
  Filled 2020-10-01: qty 100

## 2020-10-01 MED ORDER — ORAL CARE MOUTH RINSE
15.0000 mL | OROMUCOSAL | Status: DC
  Administered 2020-10-01 – 2020-10-03 (×11): 15 mL via OROMUCOSAL

## 2020-10-01 MED ORDER — NOREPINEPHRINE 4 MG/250ML-% IV SOLN
INTRAVENOUS | Status: AC
  Administered 2020-10-01: 20 ug/min via INTRAVENOUS
  Filled 2020-10-01: qty 250

## 2020-10-01 MED ORDER — SODIUM BICARBONATE 8.4 % IV SOLN: 50.0000 meq | Freq: Once | INTRAVENOUS | Status: DC

## 2020-10-01 MED ORDER — NOREPINEPHRINE 16 MG/250ML-% IV SOLN
0.0000 ug/min | INTRAVENOUS | Status: DC
  Administered 2020-10-01: 20 ug/min via INTRAVENOUS
  Administered 2020-10-02: 15 ug/min via INTRAVENOUS
  Administered 2020-10-03: 10 ug/min via INTRAVENOUS
  Filled 2020-10-01 (×4): qty 250

## 2020-10-01 MED ORDER — FENTANYL CITRATE (PF) 100 MCG/2ML IJ SOLN
INTRAMUSCULAR | Status: AC
  Administered 2020-10-01: 100 ug via INTRAVENOUS
  Filled 2020-10-01: qty 2

## 2020-10-01 MED ORDER — POLYETHYLENE GLYCOL 3350 17 G PO PACK: 17.0000 g | PACK | Freq: Every day | ORAL | Status: DC

## 2020-10-01 MED ORDER — SODIUM CHLORIDE 0.9 % IV SOLN
2.0000 g | INTRAVENOUS | Status: DC
  Administered 2020-10-01 – 2020-10-03 (×3): 2 g via INTRAVENOUS
  Filled 2020-10-01: qty 2
  Filled 2020-10-01: qty 20
  Filled 2020-10-01: qty 2
  Filled 2020-10-01: qty 20

## 2020-10-01 MED ORDER — FENTANYL CITRATE (PF) 100 MCG/2ML IJ SOLN
INTRAMUSCULAR | Status: AC
  Filled 2020-10-01: qty 2

## 2020-10-01 MED ORDER — EZETIMIBE 10 MG PO TABS
10.0000 mg | ORAL_TABLET | Freq: Every day | ORAL | Status: DC
  Administered 2020-10-01 – 2020-10-02 (×2): 10 mg
  Filled 2020-10-01 (×2): qty 1

## 2020-10-01 MED ORDER — FENTANYL CITRATE (PF) 100 MCG/2ML IJ SOLN
50.0000 ug | Freq: Once | INTRAMUSCULAR | Status: AC
Start: 2020-10-01 — End: 2020-10-01
  Administered 2020-10-01: 50 ug via INTRAVENOUS

## 2020-10-01 NOTE — Progress Notes (Signed)
Pulled back ETT 1 cm per MD. It is now at 24 cm at the lip.

## 2020-10-01 NOTE — Progress Notes (Signed)
Progress Note  Patient Name: Sara [name] Date of Encounter: 10/01/2020  [facility] Cardiologist: Evalina Field, MD   Subjective   Decompensated overnight. No urine output to 40 mg and then 80 mg IV lasix. Tachypneic, confused, cold on exam. Transferred to the ICU, with Dr. Haroldine Laws with AHF at bedside. Had received ativan overnight, did not improve with flumazenil. Started on levophed, PCCM and respiratory intubated patient. I spoke with son [name]. He reports that she was doing well until around dinner yesterday, was more agitated but still clear. Felt like she was choking. Was placed on nasal cannula and he said she seemed a little better, even though her oxygen numbers had been ok.  We discussed her decompensation and her multiple medical issues at this time. She is in cardiogenic shock, respiratory failure, and likely acute renal failure. She also has severe PAD, plus her CAD and aortic stenosis. She is not a good candidate for surgery until her clinical condition improves. He understands. We did discuss potentially needing dialysis if she does not respond to diuretics, and he would like to talk to his other family members about this.  Labs reviewed as below, notable for lactate of 9.  Inpatient Medications    Scheduled Meds: . aspirin EC  81 mg Oral Daily  . atorvastatin  80 mg Oral Daily  . Chlorhexidine Gluconate Cloth  6 each Topical Daily  . enoxaparin (LOVENOX) injection  30 mg Subcutaneous Q24H  . ezetimibe  10 mg Oral Daily  . feeding supplement (GLUCERNA SHAKE)  237 mL Oral BID BM  . hydrALAZINE  25 mg Oral Q8H  . insulin aspart  0-15 Units Subcutaneous QID  . insulin aspart  3 Units Subcutaneous QID  . insulin glargine  12 Units Subcutaneous Daily  . isosorbide mononitrate  30 mg Oral Daily  . polyethylene glycol  17 g Oral Daily   Continuous Infusions: . sodium chloride 10 mL/hr at 09/30/20 1900  . fentaNYL infusion INTRAVENOUS 25 mcg/hr (10/01/20 0914)   . milrinone 0.25 mcg/kg/min (09/30/20 1932)  . norepinephrine (LEVOPHED) Adult infusion 20 mcg/min (10/01/20 0815)   PRN Meds: sodium chloride, ondansetron (ZOFRAN) IV, sodium chloride flush, traZODone   Vital Signs    Vitals:   09/30/20 2019 10/01/20 0016 10/01/20 0500 10/01/20 0853  BP: 116/65 124/72 126/74   Pulse: (!) 117 (!) 124 (!) 115   Resp: 18  (!) 22   Temp: 98.5 F (36.9 C) 98.7 F (37.1 C) 98.7 F (37.1 C)   TempSrc: Oral Oral Oral   SpO2: 90% 94% 91% 100%  Weight:   61.6 kg   Height:        Intake/Output Summary (Last 24 hours) at 10/01/2020 1010 Last data filed at 10/01/2020 0600 Gross per 24 hour  Intake 1538.41 ml  Output -  Net 1538.41 ml   Last 3 Weights 10/01/2020 09/30/2020 09/29/2020  Weight (lbs) 135 lb 12.9 oz 135 lb 8.6 oz 138 lb 1.6 oz  Weight (kg) 61.6 kg 61.48 kg 62.642 kg      Telemetry    Sinus tachycardia with PVCs - Personally Reviewed  ECG    Ordered this AM - Personally Reviewed  Physical Exam   Currently (post intubation): GEN: intubated and sedated NECK: JVD to jaw at 45 degrees CARDIAC: tachycardic but regular rhythm, normal S1 and S2, no rubs or gallops. 2-3/6 SE murmur. VASCULAR: radial pulses dopplerable but faint to palpation. Bilateral lower extremities cold, without palpable pulses RESPIRATORY:  Now  intubated with ventilated breath sounds, but largely clear ABDOMEN: Soft, non-tender, non-distended MUSCULOSKELETAL:  Moves all 4 limbs independently prior to intubation. Does respond to voice on sedation SKIN: cool lower extremities NEUROLOGIC:  sedated PSYCHIATRIC:  sedated  Labs    High Sensitivity Troponin:   Recent Labs  Lab 09/08/2020 1343 09/18/2020 1608 09/14/2020 2210 09/21/20 0636 09/21/20 0832  TROPONINIHS 1,109* 1,413* 2,156* 1,643* 1,826*      Chemistry Recent Labs  Lab 09/28/20 0500 09/29/20 0439 09/30/20 0518 10/01/20 0447 10/01/20 0829  NA 136 137 136 135 132*  K 3.6 3.7 3.9 4.5 4.5  CL 104 103  103 103  --   CO2 24 24 23  18*  --   GLUCOSE 171* 154* 230* 251*  --   BUN 48* 44* 47* 50*  --   CREATININE 2.06* 2.21* 2.37* 2.65*  --   CALCIUM 7.8* 8.3* 8.2* 8.5*  --   PROT 4.9*  --   --   --   --   ALBUMIN 1.8*  --   --   --   --   AST 26  --   --   --   --   ALT 15  --   --   --   --   ALKPHOS 78  --   --   --   --   BILITOT 0.9  --   --   --   --   GFRNONAA 26* 24* 22* 19*  --   ANIONGAP 8 10 10 14   --      Hematology Recent Labs  Lab 09/30/20 0518 09/30/20 2000 10/01/20 0447 10/01/20 0829 10/01/20 0841  WBC 8.0  --  10.2  --  11.6*  RBC 2.41*  --  2.95*  --  2.89*  HGB 7.0*   < > 8.5* 8.2* 8.2*  HCT 22.8*   < > 26.3* 24.0* 27.2*  MCV 94.6  --  89.2  --  94.1  MCH 29.0  --  28.8  --  28.4  MCHC 30.7  --  32.3  --  30.1  RDW 15.0  --  18.7*  --  18.8*  PLT 404*  --  451*  --  501*   < > = values in this interval not displayed.    BNP Recent Labs  Lab 10/01/20 0842  BNP 3,088.2*     DDimer No results for input(s): DDIMER in the last 168 hours.   Radiology    DG CHEST PORT 1 VIEW  Result Date: 10/01/2020 CLINICAL DATA:  Intubation EXAM: PORTABLE CHEST 1 VIEW COMPARISON:  Earlier today FINDINGS: Endotracheal tube with tip 1 cm above the carina. The enteric tube reaches the stomach. Right PICC with tip at the SVC. Cardiomegaly. Interstitial opacity and pleural effusions. No pneumothorax. IMPRESSION: 1. Endotracheal tube with tip 1 cm above the carina. 2. Mild improvement in pulmonary edema.  Layering pleural effusions. Electronically Signed   By: Monte Fantasia M.D.   On: 10/01/2020 09:15   DG CHEST PORT 1 VIEW  Result Date: 10/01/2020 CLINICAL DATA:  Difficulty breathing EXAM: PORTABLE CHEST 1 VIEW COMPARISON:  09/16/2020 FINDINGS: Cardiac shadow is enlarged but stable. Right-sided PICC line is now seen in satisfactory position at the cavoatrial junction. Increasing vascular congestion is noted consistent with CHF with mild interstitial parenchymal edema  identified. Persistent but improved right basilar infiltrate is seen. Small effusions are noted bilaterally. No bony abnormality is seen. IMPRESSION: Increasing changes of CHF. Some persistent right  basilar airspace opacity is noted likely representing superimposed infiltrate. PICC line in satisfactory position. Electronically Signed   By: Inez Catalina M.D.   On: 10/01/2020 08:36   VAS US DOPPLER PRE CABG  Result Date: 09/29/2020 PREOPERATIVE VASCULAR EVALUATION  Indications:           Pre-CABG. Risk Factors:          Hypertension, Diabetes, coronary artery disease, prior                        CVA. Vascular               Left CEA approx 3 yrs ago per patient. Interventions: Comparison Study:      No previous here Performing Technologist: Vonzell Schlatter RVT  Examination Guidelines: A complete evaluation includes B-mode imaging, spectral Doppler, color Doppler, and power Doppler as needed of all accessible portions of each vessel. Bilateral testing is considered an integral part of a complete examination. Limited examinations for reoccurring indications may be performed as noted.  Right Carotid Findings: +----------+--------+--------+--------+------------+--------+           PSV cm/sEDV cm/sStenosisDescribe    Comments +----------+--------+--------+--------+------------+--------+ CCA Prox  99      17                                   +----------+--------+--------+--------+------------+--------+ CCA Distal113     23                                   +----------+--------+--------+--------+------------+--------+ ICA Prox  126     26      40-59%  heterogenous         +----------+--------+--------+--------+------------+--------+ ICA Distal66      19                                   +----------+--------+--------+--------+------------+--------+ ECA       137     13                                   +----------+--------+--------+--------+------------+--------+ Portions of this  table do not appear on this page. +----------+--------+-------+--------+------------+           PSV cm/sEDV cmsDescribeArm Pressure +----------+--------+-------+--------+------------+ Subclavian175                                 +----------+--------+-------+--------+------------+ +---------+--------+--+--------+--+ VertebralPSV cm/s63EDV cm/s19 +---------+--------+--+--------+--+ Left Carotid Findings: +----------+--------+--------+--------+-----------+--------+           PSV cm/sEDV cm/sStenosisDescribe   Comments +----------+--------+--------+--------+-----------+--------+ CCA Prox  119     17                                  +----------+--------+--------+--------+-----------+--------+ CCA Distal131     27                                  +----------+--------+--------+--------+-----------+--------+ ICA Prox  93      23      1-39%   homogeneous         +----------+--------+--------+--------+-----------+--------+  ICA Distal103     36                                  +----------+--------+--------+--------+-----------+--------+ ECA       376     48      >50%                        +----------+--------+--------+--------+-----------+--------+ +----------+--------+--------+--------+------------+ SubclavianPSV cm/sEDV cm/sDescribeArm Pressure +----------+--------+--------+--------+------------+           172                                  +----------+--------+--------+--------+------------+ +---------+--------+---+--------+--+ VertebralPSV cm/s123EDV cm/s42 +---------+--------+---+--------+--+  ABI Findings: +---------+-----------------+-----+------------------+------------------------+ Right    Rt Pressure      IndexWaveform          Comment                           (mmHg)                                                           +---------+-----------------+-----+------------------+------------------------+ Brachial                        triphasic         PICC no pressures                                                         allowed                  +---------+-----------------+-----+------------------+------------------------+ PTA      40               0.37 dampened                                                                  monophasic                                 +---------+-----------------+-----+------------------+------------------------+ DP       40               0.37 dampened                                                                  monophasic                                 +---------+-----------------+-----+------------------+------------------------+  Great Toe0                0.00 Absent                                     +---------+-----------------+-----+------------------+------------------------+ +--------+------------------+-----+----------+-------+ Left    Lt Pressure (mmHg)IndexWaveform  Comment +--------+------------------+-----+----------+-------+ [id number]                    triphasic         +--------+------------------+-----+----------+-------+ PTA     62                0.58 monophasic        +--------+------------------+-----+----------+-------+ DP      78                0.73 monophasic        +--------+------------------+-----+----------+-------+ +-------+---------------+----------------+ ABI/TBIToday's ABI/TBIPrevious ABI/TBI +-------+---------------+----------------+ Right  .37                             +-------+---------------+----------------+ Left   .73                             +-------+---------------+----------------+  Right Doppler Findings: +-----------+--------+-----+---------+-------------------------+ Site       PressureIndexDoppler  Comments                  +-----------+--------+-----+---------+-------------------------+ Brachial                triphasicPICC no pressures allowed  +-----------+--------+-----+---------+-------------------------+ Radial                  triphasic                          +-----------+--------+-----+---------+-------------------------+ Ulnar                   triphasic                          +-----------+--------+-----+---------+-------------------------+ Palmar Arch             triphasic                          +-----------+--------+-----+---------+-------------------------+  Left Doppler Findings: +-----------+--------+-----+---------+--------+ Site       PressureIndexDoppler  Comments +-----------+--------+-----+---------+--------+ Brachial   107          triphasic         +-----------+--------+-----+---------+--------+ Radial                  triphasic         +-----------+--------+-----+---------+--------+ Ulnar                   triphasic         +-----------+--------+-----+---------+--------+ Palmar Arch             triphasic         +-----------+--------+-----+---------+--------+  Summary: Right Carotid: Velocities in the right ICA are consistent with a 40-59%                stenosis. Left Carotid: Velocities in the left ICA are consistent with a 1-39% stenosis.               The ECA appears >50% stenosed. Vertebrals:  Bilateral vertebral arteries demonstrate antegrade flow. Subclavians: Normal flow hemodynamics were seen in bilateral subclavian              arteries. Right ABI: Resting right ankle-brachial index indicates severe right lower extremity arterial disease. The right toe-brachial index is abnormal. Left ABI: Resting left ankle-brachial index indicates moderate left lower extremity arterial disease. The left toe-brachial index is abnormal. Right Upper Extremity: Doppler waveforms decrease >50% with right radial compression. Doppler waveform obliterate with right ulnar compression. Left Upper Extremity: Doppler waveform obliterate with left radial compression. Doppler waveform obliterate with  left ulnar compression.  Electronically signed by Deitra Mayo MD on 09/29/2020 at 3:46:45 PM.    Final    VAS [place] LOWER EXTREMITY VENOUS (DVT)  Result Date: 09/29/2020  Lower Venous DVT Study Indications: Pain.  Risk Factors: None identified. Anticoagulation: Lovenox. Comparison Study: No previous Performing Technologist: Vonzell Schlatter RVT  Examination Guidelines: A complete evaluation includes B-mode imaging, spectral Doppler, color Doppler, and power Doppler as needed of all accessible portions of each vessel. Bilateral testing is considered an integral part of a complete examination. Limited examinations for reoccurring indications may be performed as noted. The reflux portion of the exam is performed with the patient in reverse Trendelenburg.  +---------+---------------+---------+-----------+----------+--------------+ RIGHT    CompressibilityPhasicitySpontaneityPropertiesThrombus Aging +---------+---------------+---------+-----------+----------+--------------+ CFV      Full           Yes      Yes                                 +---------+---------------+---------+-----------+----------+--------------+ SFJ      Full                                                        +---------+---------------+---------+-----------+----------+--------------+ FV Prox  Full                                                        +---------+---------------+---------+-----------+----------+--------------+ FV Mid   Full                                                        +---------+---------------+---------+-----------+----------+--------------+ FV DistalFull                                                        +---------+---------------+---------+-----------+----------+--------------+ PFV      Full                                                        +---------+---------------+---------+-----------+----------+--------------+ POP      Full  Yes      Yes                                  +---------+---------------+---------+-----------+----------+--------------+ PTV      Full                                                        +---------+---------------+---------+-----------+----------+--------------+ PERO     Full                                                        +---------+---------------+---------+-----------+----------+--------------+   Summary: RIGHT: - There is no evidence of deep vein thrombosis in the lower extremity. - There is no evidence of superficial venous thrombosis.  - No cystic structure found in the popliteal fossa.   *See table(s) above for measurements and observations. Electronically signed by Deitra Mayo MD on 09/29/2020 at 3:45:58 PM.    Final     Cardiac Studies   Vascular studies 3.22.22 Summary:  Right Carotid: Velocities in the right ICA are consistent with a 40-59%         stenosis.   Left Carotid: Velocities in the left ICA are consistent with a 1-39%  stenosis.        The ECA appears >50% stenosed.  Vertebrals: Bilateral vertebral arteries demonstrate antegrade flow.  Subclavians: Normal flow hemodynamics were seen in bilateral subclavian        arteries.   Right ABI: Resting right ankle-brachial index indicates severe right lower  extremity arterial disease. The right toe-brachial index is abnormal.  Left ABI: Resting left ankle-brachial index indicates moderate left lower  extremity arterial disease. The left toe-brachial index is abnormal.  Right Upper Extremity: Doppler waveforms decrease >50% with right radial  compression. Doppler waveform obliterate with right ulnar compression.  Left Upper Extremity: Doppler waveform obliterate with left radial  compression. Doppler waveform obliterate with left ulnar compression.   Cardiac Cath: 09/23/2020  Ost LAD lesion is 35% stenosed.  Prox LAD to Mid LAD lesion is 95% stenosed.  Lat 1st Diag lesion is 90%  stenosed.  1st Diag-1 lesion is 90% stenosed.  1st Diag-2 lesion is 80% stenosed.  Prox Cx lesion is 75% stenosed.  1st Mrg lesion is 40% stenosed.  Mid Cx to Dist Cx lesion is 75% stenosed.  Prox RCA to Mid RCA lesion is 80% stenosed.  Dist RCA lesion is 50% stenosed.  RPDA lesion is 80% stenosed.  1st RPL lesion is 80% stenosed.  1. Severe three-vessel coronary artery disease:  Severe stenosis of the mid LAD/first diagonal with complex calcific disease  Severe stenosis of the proximal and distal circumflex  Severe stenosis of the mid RCA and the PDA/PLA  2. Hemodynamic findings consistent with probable moderate aortic stenosis, possibly with low flow component in the setting of severe LV dysfunction. Peak to peak gradient 16 mmHg, mean gradient 9 mmHg. Aortic valve crossed with a J-wire.  3. Preserved cardiac output on milrinone with cardiac index 5 L/min/m, low right heart filling pressures, LVEDP 19 mmHg.  RA 2 RV 25/1 PA 29/10 mean 20  Pulmonary capillary wedge pressure 12 LVEDP 19  PA oxygen saturation 73% Ao oxygen saturation 97%  Cardiac output 8.24 L/min Cardiac index 5.0 L/min/m  Recommend: cardiac surgical evaluation for CABG/AVR. High risk candidate. Multidisciplinary heart team review.  Diagnostic Dominance: Right     Echo: 09/21/2020 1. Left ventricular ejection fraction, by estimation, is 25 to 30%. The  left ventricle has severely decreased function. The left ventricle  demonstrates regional wall motion abnormalities (see scoring  diagram/findings for description). The left  ventricular internal cavity size was mildly dilated. There is mild left  ventricular hypertrophy. Left ventricular diastolic parameters are  consistent with Grade II diastolic dysfunction (pseudonormalization).  There is severe akinesis of the left  ventricular, mid-apical anteroseptal wall and apical segment. There is  severe akinesis of the left  ventricular, entire inferior wall.  2. Right ventricular systolic function is normal. The right ventricular  size is normal. There is mildly elevated pulmonary artery systolic  pressure.  3. Left atrial size was mildly dilated.  4. The mitral valve is grossly normal. Trivial mitral valve  regurgitation.  5. The aortic valve is calcified. There is moderate calcification of the  aortic valve. There is moderate thickening of the aortic valve. Aortic  valve regurgitation is trivial. Mild aortic valve stenosis.  Aortic valve mean gradient measures 11.8 mmHg. Aortic  valve peak gradient measures 20.6 mmHg. Aortic valve area, by VTI measures  0.89 cm.   Echocardiogram 01/26/2020 (Care Everywhere): 1. Mild LV dysfunction. EF is in the 40-45% range. The pattern is  worrisome for CAD.  2. Mild calcific aortic stenosis. Gradients are 20/11 mmHg.  3. Mild LVH _______________  Left Cardiac Catheterization 02/04/2020 (Care Everywhere): Mid LAD 70% stenosis, distal LCx 70% stenosis, mid RCA 80% stenosis, proximal RPDA 50% stenosis, left main unremarkable. _______________  Cardiac MRI 02/06/2020 (Care Everywhere): LVEF 51%, RVEF 67%, LV scar 4%, small subendocardial infarction of basal anterior/mid anterolateral wall (LAD or ramus), small subendocardial infarction of mid inferoseptum (RCA), dysfunctional myocardium in all 3 coronary artery distributions predominantly viable, mild-moderate AS (AVA 1.0cm2, peak gradient 41mmHg)  Patient Profile     70 y.o. female with a history of three vessel CAD noted on cardiac catheterization in 01/2020, ischemic cardiomyopathy/chronic systolic CHF, stroke, hypertension, diabetes mellitus, CKD III-IV,admitted 03/25for CHF andNSTEMI  Assessment & Plan    Cardiogenic shock Acute on chronic systolic and diastolic heart failure Ischemic cardiomyopathy Acute hypoxic respiratory failure/respiratory fatigue Lactic acidosis -lactate 9.2 -ABG 7.33/21/122  with bicarb 11 -coox improved on recheck. Appears likely cardiogenic given clinical picture and low procalcitonin, but will culture to not miss sepsis.  -appreciate Dr. Haroldine Laws and the advanced heart failure team -appreciate Dr. Valeta Harms and PCCM team -continue aspirin, statin, ezetimibe for now -will check LFTs  NSTEMI Severe obstructive CAD, multivessel Moderate to severe aortic stenosis, likely low flow-low gradient severe aortic stenosis -had been planned for CABG-AVR tomorrow with Dr. Cyndia Bent, but given decompensation this is on hold.  PAD, with mild-moderate carotid stenosis and moderate to severe bilateral LE PAD -was warm yesterday but cool today. With significant lower extremity PAD, need to be cautious about loss of perfusion from vasoconstriction/hypotension/low cardiac output  Acute kidney injury and possibly renal failure Chronic kidney disease, stage 4 anemia of chronic disease -urine output not well charted yesterday, but has not responded to two doses of lasix -Cr rising, most recent 2.65. CVP only about 12, difficult situation -will aim for diuresis with meds if CVP rises, but I have discussed  possible need for dialysis with patient's son if she does not respond to medication -responded to 1 U PRBC 09/30/20  PVCs: -monitor electrolytes, most recent K 4.5  Type II diabetes: -changing SSI to NPO dosing -continue glargine, monitor   CRITICAL CARE Patient is critically ill with multiple organ systems affected and requires high complexity decision making. Total critical care time: 70 minutes. This time includes gathering of history, evaluation of patient's response to treatment, examination of patient, review of laboratory and imaging studies, and coordination with consultants. Greater than 50% of time spent in direct patient care.  For questions or updates, please contact [place] Please consult [email or web address] for contact info under        Signed, Buford Dresser, MD  10/01/2020, 10:10 AM

## 2020-10-01 NOTE — Procedures (Signed)
Arterial Catheter Insertion Procedure Note  Sara [name]  [id number]  1951-06-29  Date:10/01/20  Time:11:58 AM    Provider Performing: Veverly Fells.    Procedure: Insertion of Arterial Line [phone]) with US guidance (86381)   Indication(s) Blood pressure monitoring and/or need for frequent ABGs  Consent Risks of the procedure as well as the alternatives and risks of each were explained to the patient and/or caregiver.  Consent for the procedure was obtained and is signed in the bedside chart  Anesthesia None   Time Out Verified patient identification, verified procedure, site/side was marked, verified correct patient position, special equipment/implants available, medications/allergies/relevant history reviewed, required imaging and test results available.   Sterile Technique Maximal sterile technique including full sterile barrier drape, hand hygiene, sterile gown, sterile gloves, mask, hair covering, sterile ultrasound probe cover (if used).   Procedure Description Left radial attempted, able to access artery but unable to pass wire. Unable to place catheter on right radial due to PICC line placement.  Area of catheter insertion was cleaned with chlorhexidine and draped in sterile fashion. With real-time ultrasound guidance an arterial catheter was placed into the right femoral artery.  Appropriate arterial tracings confirmed on monitor.     Complications/Tolerance None; patient tolerated the procedure well.   EBL Minimal   Specimen(s) None   Redmond School., MSN, APRN, AGACNP-BC [facility]  10/01/2020 , 12:01 PM   Please see [email or web address] for pager details  From 7a-7p if no response, please call [phone] After hours, please call Elink at [phone]

## 2020-10-01 NOTE — Progress Notes (Signed)
    Overnight cardiology fellow called to the room for increased WOB. Pt was given $RemoveB'40mg'ktZPWXrv$  IV Lasix with no UO and no improvement in symptoms. She was given an additional $RemoveBefor'80mg'hHrYNBMYEhWr$  IV Lasix approximately 0645, still with poor response. On my assessment this morning, the patient was responsive to loud voice however was lethargic, cool to touch and moaning. BP was stable however O2 saturations on 2L [place] was 89%. She was placed on NRB with some improvement in mentation and oxygen saturations. CXR, BNP, bladder scan and blood gas was ordered. She was transferred to 2H06 at which time she was met by Dr. Haroldine Laws. She was placed on Levophed out of concern for brewing sepsis. Procalcitonin, lactic acid, UA and blood cultures drawn. PCCM asked to evaluate for possible intubation.    Kathyrn Drown NP-C [place] Pager: [phone]

## 2020-10-01 NOTE — Progress Notes (Signed)
TCTS  I will see her later today after the OR but I don't think she can be done tomorrow with a rising creatinine. Risk of anuric renal failure is too high and that will greatly complicate her perioperative course and chance of recovery. I would ask nephrology to see her.

## 2020-10-01 NOTE — Progress Notes (Signed)
Inpatient Diabetes Program Recommendations  AACE/ADA: New Consensus Statement on Inpatient Glycemic Control   Target Ranges:  Prepandial:   less than 140 mg/dL      Peak postprandial:   less than 180 mg/dL (1-2 hours)      Critically ill patients:  140 - 180 mg/dL   Results for PEARSON, REASONS (MRN [id number]) as of 10/01/2020 09:04  Ref. Range 09/30/2020 07:42 09/30/2020 11:54 09/30/2020 16:39 09/30/2020 20:18 10/01/2020 08:14  Glucose-Capillary Latest Ref Range: 70 - 99 mg/dL 247 (H) 192 (H) 294 (H) 143 (H) 282 (H)   Review of Glycemic Control  Diabetes history: DM2 Outpatient Diabetes medications: Glucotrol XL 10 mg bid, Janumet XR [phone] mg daily Current orders for Inpatient glycemic control: Lantus 12 units daily, Novolog 0-15 units TID with meals, Novolog 0-5 units QHS, Novolog 3 units TID with meals  Inpatient Diabetes Program Recommendations:    Insulin: Please consider increasing Lantus to 15 units daily and if patient is made NPO please consider changing frequency of CBGs and Novolog correction to Q4H.   Thanks, Barnie Alderman, RN, MSN, CDE Diabetes Coordinator Inpatient Diabetes Program [phone] (Team Pager from 8am to 5pm)

## 2020-10-01 NOTE — Progress Notes (Signed)
0800- Patient received from [place] on Non-rebreather O2 and monitor.  Patient moaning in pain stating she cannot open her eyes.  Dr. Jeffie Pollock, Dr. Harrell Gave, and Dr. Valeta Harms at bedside.  CRT >3sec, extremities dusky, no doppler pulses in feet.  Levophed started and titrated per Dr. Jeffie Pollock.  ABG obtained at 0820. CXR obtained at [place].  Levo increased to 20 mcg/min and milrinone decreased to 0.125 mcg/kg/min per Dr. Jerilee Field.  Bicarb given x 2.  Patient continues with increased work of breathing.  Critical care team at bedside.  Pt intubated at 0850 without difficulty.  See MAR for further sedation and medications.  Family updated via phone.

## 2020-10-01 NOTE — Progress Notes (Signed)
PCCM:   Full consult note to follow.  This is a 70 year old female with past medical history of three-vessel coronary disease cardiac catheterization in July 2021, ischemic cardiomyopathy, chronic systolic heart failure, stroke, severe peripheral arterial disease, hypertension, type 2 diabetes, CKD 3/4.  Patient was admitted on 09/12/2020 for a acute on chronic systolic heart failure exacerbation, NSTEMI.  Found to have severe low gradient aortic stenosis and an ejection fraction of 25 to 30%.  Patient was awaiting optimization for CABG plus AVR with Dr. Caffie Pinto.  Patient had decompensation event in the early morning of 10/01/2020 and was transferred to the intensive care unit.  Patient was found to have respiratory distress, severe metabolic acidosis with a lactate of 9.2.  Pulmonary critical care was consulted for recommendations and management.  Decision was made for endotracheal intubation.  Please see procedure note separately documented.  Patient was seen and evaluated prior to intubation.  She was awake to move all 4 extremities however labored breathing, tachypneic.  All extremities cyanotic to touch.  BP 126/74 (BP Location: Left Arm)   Pulse (!) 115   Temp 98.7 F (37.1 C) (Oral)   Resp (!) 22   Ht 5\' 2"  (1.575 m)   Wt 61.6 kg   SpO2 100%   BMI 24.84 kg/m   General: Elderly female respiratory distress HEENT: Tracking appropriately, JVD Heart: Systolic murmur, S1-U8, distant heart tones Lungs: Poor air movement bilaterally some basilar crackles Abdomen: Mildly distended Extremities: Significant cyanosis and cool to the touch.  Labs: Reviewed New lactic acid 9.2 Bicarb 13  Echo 09/21/2020: Ejection fraction 25 to 30% severe left ventricular akinesis aortic valve peak gradient 20.6 mmHg, aortic valve area 0.89 cm3  Assessment: Cardiogenic shock Acute on chronic systolic heart failure Ischemic cardiomyopathy CAD, severe aortic stenosis Acute hypoxemic respiratory failure  requiring intubation and mechanical ventilation Acute on chronic kidney disease, CKD stage IV  Plan: Vasopressor support with norepinephrine to maintain mean arterial pressure greater than 65 Milrinone infusion Check CVP and co-ox  Follow urine output closely At risk for need of renal replacement therapy Start empiric antimicrobials Cultures pending Continue aspirin statin plus Zetia  This patient is critically ill with multiple organ system failure; which, requires frequent high complexity decision making, assessment, support, evaluation, and titration of therapies. This was completed through the application of advanced monitoring technologies and extensive interpretation of multiple databases. During this encounter critical care time was devoted to patient care services described in this note for 92 minutes.  Garner Nash, DO [facility] 10/01/2020 9:56 AM

## 2020-10-01 NOTE — Progress Notes (Signed)
Nutrition Follow-up  DOCUMENTATION CODES:   Not applicable  INTERVENTION:   If unable to extubate within 24-48 hours, recommend initiation of TF  Tube Feeding Recommendations via OG: Vital 1.5 at 45 ml/hr Pro-Source TF 45 mL TID Provides 1740 kcals, 106 g of protein and 821 mL of free water   NUTRITION DIAGNOSIS:   Increased nutrient needs related to chronic illness (CHF) as evidenced by estimated needs.  Continue  GOAL:   Patient will meet greater than or equal to 90% of their needs   MONITOR:   Vent status,TF tolerance,Labs,Weight trends  REASON FOR ASSESSMENT:   Malnutrition Screening Tool    ASSESSMENT:   70 year old person with history of congestive heart failure (diagnosed July 2021), hypertension, type 2 diabetes mellitus, and prior right-sided CVA (2018) presenting with worsening shortness of breath over the past couple of weeks. Pt found to have elevated troponin, most consistent with a type II NSTEMI due to volume overload and hypoxia.   3/13 Admitted 3/14 ECHO: moderate aortic stenosis with EF 25-30% 3/18 Right hear cath notable for severe 3V disease, mod aortic stenosis; CTCS consulted for CABG and AVR 3/24 Intubated  Pt developed acute respiratory failure from acute metabolic encephalopathy and cardiogenic shock requiring intubation this AM  Patient is currently intubated on ventilator support, requiring levophed, on milrinone. Sedated on precedex and fentanyl MV: 12 L/min Temp (24hrs), Avg:98.4 F (36.9 C), Min:97.6 F (36.4 C), Max:98.7 F (37.1 C)  Propofol: NONE  Pt awaiting CABG/AVR by Dr. Cyndia Bent. Unable to be performed yet due to worsening renal function. Noted possible RRT per MD notes  Recorded po intake 50-100% of meals; nothing recorded since 3/21.   Current wt 61.6 kg; admit weight around 65 kg. No previous weight encounters.   OG tube reaches stomach per chest xray  Labs: CBGs 384, Creatinine 2.85, BUN 21 Meds: ss novolog,  novolog q 4 hours, lantus, miralax, colace  Diet Order:   Diet Order            Diet NPO time specified  Diet effective now                 EDUCATION NEEDS:   Education needs have been addressed  Skin:  Skin Assessment: Reviewed RN Assessment  Last BM:  3/22  Height:   Ht Readings from Last 1 Encounters:  10/05/2020 5\' 2"  (1.575 m)    Weight:   Wt Readings from Last 1 Encounters:  10/01/20 61.6 kg    BMI:  Body mass index is 24.84 kg/m.  Estimated Nutritional Needs:   Kcal:  1700-1900 kcals  Protein:  90-120 g  Fluid:  1.5 L/day   Kerman Passey MS, RDN, LDN, CNSC Registered Dietitian III Clinical Nutrition RD Pager and On-Call Pager Number Located in [place]

## 2020-10-01 NOTE — Care Management Important Message (Signed)
Important Message  Patient Details  Name: Sara [name] MRN: [id number] Date of Birth: 13-Jun-1951   Medicare Important Message Given:  Yes     Shelda Altes 10/01/2020, 8:53 AM

## 2020-10-01 NOTE — Progress Notes (Signed)
Noted patient lethargic with increased work of breathing. Reportedly condition worsening overnight and  received 120 mg IV lasix with no urinary output. Kathyrn Drown, NP called and made aware.  At the bedside to assess.  Received order and transferred to ICU room 2H06.  Called and updated Son [name].

## 2020-10-01 NOTE — Procedures (Signed)
Intubation Procedure Note  Maryfer Tauzin  [id number]  1950/12/31  Date:10/01/20  Time:9:10 AM   Provider Performing:Timothy Chauncey Cruel Marcello Fennel.     Procedure: Intubation (31121)  Indication(s) Respiratory Failure  Consent Unable to obtain consent due to emergent nature of procedure.   Anesthesia Etomidate, Versed and Fentanyl   Time Out Verified patient identification, verified procedure, site/side was marked, verified correct patient position, special equipment/implants available, medications/allergies/relevant history reviewed, required imaging and test results available.   Sterile Technique Usual hand hygeine, masks, and gloves were used   Procedure Description Patient positioned in bed supine.  Sedation given as noted above.  Patient was intubated with endotracheal tube using Glidescope.  View was Grade 1 full glottis .  Number of attempts was 1.  Colorimetric CO2 detector was consistent with tracheal placement.   Complications/Tolerance None; patient tolerated the procedure well. Chest X-ray is ordered to verify placement.   EBL Minimal   Specimen(s) None   Redmond School., MSN, APRN, AGACNP-BC [facility]  10/01/2020 , 9:12 AM   Please see [email or web address] for pager details  From 7a-7p if no response, please call [phone] After hours, please call Elink at [phone] ]

## 2020-10-01 NOTE — Consult Note (Signed)
Advanced Heart Failure Team Consult Note   Primary Physician: Joyice Faster, MD PCP-Cardiologist:  Evalina Field, MD  Reason for Consultation: Cardiogenic shock   HPI:    Sara [name] is seen today for evaluation of cardiogenic shock  at the request of Dr. Harrell Gave.   70 y.o. female with a history of three vessel CAD noted on cardiac catheterization in 01/2020, ischemic cardiomyopathy/chronic systolic CHF, stroke, severe PAD. hypertension, diabetes mellitus, CKD III-IV,admitted 03/4for CHF andNSTEMI. Found to have severe low-gradient AS with EF 25-30%. Awaiting CABG/AVR with Dr. Cyndia Bent. Has been on milrinone with co-ox in mid 45s.  Felt fine yesterday. Overnight more agitated and lethargic.. This am move to ICU with severe acidosis and marked respiratory distress.. Started on NE and given bicarb x 2. ABG 7.33/21/122/99%  Lactic 9.2. CCM consulted and intubated.    Review of Systems: unavailable due to extremis/intubation    Home Medications Prior to Admission medications   Medication Sig Start Date End Date Taking? Authorizing Provider  amLODipine (NORVASC) 10 MG tablet Take 10 mg by mouth daily.   Yes [provider]  aspirin EC 81 MG tablet Take 81 mg by mouth daily. Swallow whole.   Yes [provider]  cholecalciferol (VITAMIN D3) 25 MCG (1000 UNIT) tablet Take 1,000 Units by mouth daily. 3 times weekly.  Sunday,Tuesda & Friday   Yes [provider]  clopidogrel (PLAVIX) 75 MG tablet Take 75 mg by mouth daily.   Yes [provider]  ezetimibe (ZETIA) 10 MG tablet Take 10 mg by mouth daily.   Yes [provider]  glipiZIDE (GLUCOTROL XL) 10 MG 24 hr tablet Take 10 mg by mouth 2 (two) times daily.   Yes [provider]  Multiple Vitamins-Minerals (MULTIVITAMIN WITH MINERALS) tablet Take 1 tablet by mouth daily.   Yes [provider]  pravastatin (PRAVACHOL) 40 MG tablet Take 40 mg by mouth daily.    Yes [provider]  SitaGLIPtin-MetFORMIN HCl (JANUMET XR) [phone] MG TB24 Take 0.5 tablets by mouth daily.   Yes [provider]    Past Medical History: Past Medical History:  Diagnosis Date  . CHF (congestive heart failure) ([place])   . Diabetes mellitus without complication ([place])   . Hypertension   . Stroke [facility]) 2018   Reports right sided stroke, continues to have some slurred speech, but no motor defecients     Past Surgical History: Past Surgical History:  Procedure Laterality Date  . CAROTID ENDARTERECTOMY    . RIGHT/LEFT HEART CATH AND CORONARY ANGIOGRAPHY N/A 10/04/2020   Procedure: RIGHT/LEFT HEART CATH AND CORONARY ANGIOGRAPHY;  Surgeon: Sherren Mocha, MD;  Location: [facility];  Service: Cardiovascular;  Laterality: N/A;    Family History: History reviewed. No pertinent family history.  Social History: Social History   Socioeconomic History  . Marital status: Single    Spouse name: Not on file  . Number of children: Not on file  . Years of education: Not on file  . Highest education level: Not on file  Occupational History  . Not on file  Tobacco Use  . Smoking status: Current Every Day Smoker    Packs/day: 0.50    Types: Cigarettes    Start date: 40  . Smokeless tobacco: Never Used  Substance and Sexual Activity  . Alcohol use: Not Currently  . Drug use: Never  . Sexual activity: Not on file  Other Topics Concern  . Not on file  Social  History Narrative  . Not on file   Social Determinants of Health   Financial Resource Strain: Not on file  Food Insecurity: Not on file  Transportation Needs: Not on file  Physical Activity: Not on file  Stress: Not on file  Social Connections: Not on file    Allergies:  Allergies  Allergen Reactions  . Contrast Media [Iodinated Diagnostic Agents]   . Naproxen Nausea And Vomiting    Objective:    Vital Signs:   Temp:  [98.4 F (36.9 C)-98.8 F (37.1 C)] 98.7 F (37.1 C)  (03/24 0500) Pulse Rate:  [115-124] 115 (03/24 0500) Resp:  [18-22] 22 (03/24 0500) BP: (109-126)/(63-74) 126/74 (03/24 0500) SpO2:  [90 %-100 %] 100 % (03/24 0853) FiO2 (%):  [100 %] 100 % (03/24 0853) Weight:  [61.6 kg] 61.6 kg (03/24 0500) Last BM Date: 09/29/20  Weight change: Filed Weights   09/29/20 0445 09/30/20 0500 10/01/20 0500  Weight: 62.6 kg 61.5 kg 61.6 kg    Intake/Output:   Intake/Output Summary (Last 24 hours) at 10/01/2020 0933 Last data filed at 10/01/2020 0600 Gross per 24 hour  Intake 1538.41 ml  Output --  Net 1538.41 ml      Physical Exam    General:  Ill appearing. SOB HEENT: normal Neck: supple. JVP to jaw . Carotids 2+ bilat; + bruits. No lymphadenopathy or thyromegaly appreciated. Cor: PMI nondisplaced. Tachy regular + AS Lungs: tachypneic clear Abdomen: soft, nontender, nondistended. No hepatosplenomegaly. No bruits or masses. Good bowel sounds. Extremities: no cyanosis, clubbing, rash, edema cold Neuro:awake follows commands. nonfocal   Telemetry   Sinus tach 120s Personally reviewed  Labs   Basic Metabolic Panel: Recent Labs  Lab 09/28/2020 0500 09/13/2020 0948 09/27/20 0617 09/28/20 0500 09/29/20 0439 09/30/20 0518 10/01/20 0447 10/01/20 0829  NA  --    < > 134* 136 137 136 135 132*  K  --    < > 3.7 3.6 3.7 3.9 4.5 4.5  CL  --    < > 105 104 103 103 103  --   CO2  --    < > 22 24 24 23  18*  --   GLUCOSE  --    < > 257* 171* 154* 230* 251*  --   BUN  --    < > 59* 48* 44* 47* 50*  --   CREATININE  --    < > 2.23* 2.06* 2.21* 2.37* 2.65*  --   CALCIUM  --    < > 7.7* 7.8* 8.3* 8.2* 8.5*  --   MG 2.1  --   --   --  2.0  --   --   --    < > = values in this interval not displayed.    Liver Function Tests: Recent Labs  Lab 09/28/20 0500  AST 26  ALT 15  ALKPHOS 78  BILITOT 0.9  PROT 4.9*  ALBUMIN 1.8*   No results for input(s): LIPASE, AMYLASE in the last 168 hours. No results for input(s): AMMONIA in the last 168  hours.  CBC: Recent Labs  Lab 09/27/20 0617 09/28/20 0500 09/29/20 0439 09/30/20 0518 09/30/20 2000 10/01/20 0447 10/01/20 0829 10/01/20 0841  WBC 9.3  --  8.3 8.0  --  10.2  --  11.6*  HGB 7.4*   < > 7.9* 7.0* 8.4* 8.5* 8.2* 8.2*  HCT 23.5*   < > 25.3* 22.8* 26.1* 26.3* 24.0* 27.2*  MCV 92.9  --  93.4 94.6  --  89.2  --  94.1  PLT 381  --  398 404*  --  451*  --  501*   < > = values in this interval not displayed.    Cardiac Enzymes: No results for input(s): CKTOTAL, CKMB, CKMBINDEX, TROPONINI in the last 168 hours.  BNP: BNP (last 3 results) Recent Labs    10/04/2020 0704  BNP 1,649.4*    ProBNP (last 3 results) No results for input(s): PROBNP in the last 8760 hours.   CBG: Recent Labs  Lab 09/30/20 0742 09/30/20 1154 09/30/20 1639 09/30/20 2018 10/01/20 0814  GLUCAP 247* 192* 294* 143* 282*    Coagulation Studies: No results for input(s): LABPROT, INR in the last 72 hours.   Imaging   DG CHEST PORT 1 VIEW  Result Date: 10/01/2020 CLINICAL DATA:  Intubation EXAM: PORTABLE CHEST 1 VIEW COMPARISON:  Earlier today FINDINGS: Endotracheal tube with tip 1 cm above the carina. The enteric tube reaches the stomach. Right PICC with tip at the SVC. Cardiomegaly. Interstitial opacity and pleural effusions. No pneumothorax. IMPRESSION: 1. Endotracheal tube with tip 1 cm above the carina. 2. Mild improvement in pulmonary edema.  Layering pleural effusions. Electronically Signed   By: Monte Fantasia M.D.   On: 10/01/2020 09:15   DG CHEST PORT 1 VIEW  Result Date: 10/01/2020 CLINICAL DATA:  Difficulty breathing EXAM: PORTABLE CHEST 1 VIEW COMPARISON:  09/19/2020 FINDINGS: Cardiac shadow is enlarged but stable. Right-sided PICC line is now seen in satisfactory position at the cavoatrial junction. Increasing vascular congestion is noted consistent with CHF with mild interstitial parenchymal edema identified. Persistent but improved right basilar infiltrate is seen. Small  effusions are noted bilaterally. No bony abnormality is seen. IMPRESSION: Increasing changes of CHF. Some persistent right basilar airspace opacity is noted likely representing superimposed infiltrate. PICC line in satisfactory position. Electronically Signed   By: Inez Catalina M.D.   On: 10/01/2020 08:36      Medications:     Current Medications: . aspirin EC  81 mg Oral Daily  . atorvastatin  80 mg Oral Daily  . Chlorhexidine Gluconate Cloth  6 each Topical Daily  . enoxaparin (LOVENOX) injection  30 mg Subcutaneous Q24H  . ezetimibe  10 mg Oral Daily  . feeding supplement (GLUCERNA SHAKE)  237 mL Oral BID BM  . hydrALAZINE  25 mg Oral Q8H  . insulin aspart  0-15 Units Subcutaneous TID WC  . insulin aspart  0-5 Units Subcutaneous QHS  . insulin aspart  3 Units Subcutaneous TID WC  . insulin glargine  12 Units Subcutaneous Daily  . isosorbide mononitrate  30 mg Oral Daily  . polyethylene glycol  17 g Oral Daily     Infusions: . sodium chloride 10 mL/hr at 09/30/20 1900  . fentaNYL infusion INTRAVENOUS 25 mcg/hr (10/01/20 0914)  . milrinone 0.25 mcg/kg/min (09/30/20 1932)  . norepinephrine (LEVOPHED) Adult infusion 20 mcg/min (10/01/20 0815)        Assessment/Plan   1. Shock/lactic acidosis - suspect primarily cardiac but may also have septic component. Co-ox now improved - continue NE - start empiric abx - await PCT> check cultures - empiric abx for now  2. Acute on chronic CHF with ischemic cardiomyopathy -> cardiogenic shock: -Echo performed 09/24/20 with LVEF at 25-30% with mildly dilated ventricle, severely calcified aortic valve with AVA 0.87 cm2 and DVI 0.31 consistent with moderate to severe aortic stenosis>>she has known multi vessel CAD with prior plan for CABG while in [place] however this was  never performed.LHC3/18/2022showed severe CAD and at least moderate aortic stenosis, she ultimately needs CABG with AVR. Evaluated by Dr. Cyndia Bent with plans for CABG  with AVR 09/12/2020 - continue NE - follow CVP and co-ox - CVP ~12 now. Will hold off on lasix. May need CVVHD for volume control.   3. Acute hypoxic respiratory failure - intubated 3/24  - CCM following. Appreciate their support.   4. Moderate to severe AS: -Echo as above with LVEF at 25-30% with mildly dilated ventricle, severely calcified aortic valve with AVA 0.87 cm2 and DVI 0.31 consistent with moderate to severe aortic stenosiswith plans forCABG/AVRwith Dr. Cyndia Bent if/when stable -Suspect low flow/low gradient severe AS - be careful with pre-load  5. NSTEMI with known multivessel CAD: -Found to have 3VD 01/2020>>s/p cMRI with viable myocardium with workup for CABG in [place] however never performed -HsT peak at 2156 this admission>>no recurrent chest pain  -Continue ASA, statin and Zetia  -Plan for CABG/AVR if/when stable  6. Acute on chronic kidney disease stage IV:  -Baseline Cr seems to b 1.8 - 2.0  - Creatinine 2.65  today. Will follow support hemodynamically - May need CRRT - Unable to use ACEI/ARB/ARNI due to poor renal function    CRITICAL CARE Performed by: Glori Bickers  Total critical care time: 120 minutes  Critical care time was exclusive of separately billable procedures and treating other patients.  Critical care was necessary to treat or prevent imminent or life-threatening deterioration.  Critical care was time spent personally by me (independent of midlevel providers or residents) on the following activities: development of treatment plan with patient and/or surrogate as well as nursing, discussions with consultants, evaluation of patient's response to treatment, examination of patient, obtaining history from patient or surrogate, ordering and performing treatments and interventions, ordering and review of laboratory studies, ordering and review of radiographic studies, pulse oximetry and re-evaluation of patient's condition.     Length of  Stay: 60  Glori Bickers, MD  10/01/2020, 9:33 AM  Advanced Heart Failure Team Pager [phone] (M-F; 7a - 5p)  Please contact [facility] for night-coverage after hours (4p -7a ) and weekends on [email or web address]

## 2020-10-01 NOTE — Progress Notes (Signed)
Assisted with transport to 2H06 via bed with O2 and heart monitor.  ICU staff at bedside to receive patient.

## 2020-10-01 NOTE — Consult Note (Signed)
NAME:  Sara [name], MRN:  [id number], DOB:  03/21/1951, LOS: 49 ADMISSION DATE:  10/07/2020, CONSULTATION DATE:  10/01/2020 REFERRING MD:  Buford Dresser MD, CHIEF COMPLAINT:  Respiratory Failure   History of Present Illness:  Sara [name] is a 70 y.o. F with a past medical history of HFrEF (EF 40-45%), three-vessel CAD, aortic stenosis, HTN, DM2, PAD, CKD III-IV (admit creat 1.9), and prior CVA who presented to [facility] on 09/30/2020 due to worsening SOB while she was at home.She was found in the ED to have a type II NSTEMI, with elevated troponin, due to volume overload and hypoxemia and she was admitted to [facility].  Cardiology was consulted on 09/16/2020. They found that she was admitted in [place] in July of 2021 where she underwent a cardiac cath and MRI resulting in newly diagnosed with three-vessel CAD, was instructed to follow up with CTCS but was unable to follow up due to the COVID-19 pandemic. Her ECHO on 3/14  was notable for moderate aortic stenosis with an EF of 25 to 30%. Her right heart cath on 09/12/2020 was also notable for severe three vessel disease, moderate aortic stenosis, and a cardiac index of 5 while on milrinone. CTCS was consulted on 09/11/2020 for evaluation with plans to complete a CABG and AVR with Dr. Cyndia Bent however timing of the surgery was uncertain due to a rising creatinine.  The morning of 10/01/2020 Sara [name] decompensated becoming cold, confused, oliguric, and tachypnic. She had received ativan overnight and her symptoms did not improve with flumazenil. She was transferred to [facility] ICU, she was started on levophed for cardiogenic shock and possible acute renal failure. PCCM was consulted for assistance with her respiratory failure. She was intubated on 03/24 and remains critically ill.   Pertinent  Medical History  HFrEF HTN Three-vessel CAD Moderate to Severe Aortic Stenosis PAD CKD III-IV Prior CVA  Significant Hospital Events: Including  procedures, antibiotic start and stop dates in addition to other pertinent events   . Transfer to [place] . ETT 03/24 >>  . Ceftriaxone 03/24 >> . Blood Culture 03/24 >> . Urine Culture 03/24 >>  Interim History / Subjective:  TMAX 98.8, I&O +1.5L UOP  Decompensated overnight. Had received ativan, reverse with flumanezil did not improve Timing of CTCS uncertain due to clinical status. Given 40 then 80mg  of furosemide without response prior to transfer to ICU.  20mg /kg Norepi, 5mg  Milrinone  Unable to obtain subjective exam due to patient status Objective   Blood pressure 126/74, pulse (!) 115, temperature 98.7 F (37.1 C), temperature source Oral, resp. rate (!) 22, height 5\' 2"  (1.575 m), weight 61.6 kg, SpO2 100 %. CVP:  [7 mmHg-8 mmHg] 8 mmHg  Vent Mode: PRVC FiO2 (%):  [100 %] 100 % Set Rate:  [30 bmp] 30 bmp Vt Set:  [400 mL] 400 mL PEEP:  [5 cmH20] 5 cmH20 Plateau Pressure:  [17 cmH20] 17 cmH20   Intake/Output Summary (Last 24 hours) at 10/01/2020 0916 Last data filed at 10/01/2020 0600 Gross per 24 hour  Intake 1538.41 ml  Output --  Net 1538.41 ml   Filed Weights   09/29/20 0445 09/30/20 0500 10/01/20 0500  Weight: 62.6 kg 61.5 kg 61.6 kg    Examination: General: Ill appearing, sedated, lying in bed HENT: Trachea midline, mucous membranes moist, ETT and OG in place, JVD present Lungs: Symmetrical chest expansion, air movement throughout all lobes, clear to white secretions Cardiovascular: Sinus Tach, regular rate, rhythm, X4I0, systolic murmur Abdomen: nondistended, hypoactive  bowel sounds, soft Extremities: cap refill greater than three seconds, extremities cold Neuro: Moves all extremities, PEERL, purposeful movement   Labs/imaging that I havepersonally reviewed   ABG, Venous Lactate, Pro calcitonin, BMP    Resolved Hospital Problem list      Assessment & Plan:  Sara [name] is a 70 y.o. female with a pertinent past medical history of HFrEF (EF  40-45%), three-vessel CAD, aortic stenosis, HTN, DM2, PAD, CKD III-IV, who was awaiting a AVR and CABG with CTCS. Overnight she became confused, developed respiratory distress, and cardiogenic shock. She was transferred to [facility], where PCCM was consulted for assistance with respiratory failure.  Cardiogenic Shock, with Lactic Acidosis Acute on Chronic Systolic Heart Failure Ischemic Cardiomyopathy NSTEMI CAD Severe Aortic Stenosis PAD PVCs Lactic acid 9.2, hypoperfusion due to cardiogenic shock in the setting of cardiac disease, CVP 12, possible FVO -Continue Milrinone infusion -Continue Levophed, will place arterial line for vasopressor titration  -Goal MAP above 65 -Continue to follow CVP -Follow up 12 lead EKG -Will repeat BMP, Lactate, and CoOX this afternoon to evaluate resuscitation -Continue ASA/Statin plus Zetia -Continue monitoring electrolytes, replace as needed   Acute Metabolic Encephalopathy Likely due to hypoperfusion from cardiogenic shock -Starting empiric Ceftriaxone due concern for possibility of an aspiration pneumonia -Follow up procal, post intubation CXR, and cultures -Initiate PAD bundle with fentanyl/precedex, wean to RASS goal 0 to -1 -Initiate Delirium prevention measures  Acute Hypoxemic Respiratory Failure, Requiring Intubation and Mechanical Ventilation In the setting of cardiogenic shock and acute metabolic encephalopathy -Low tidal volume strategy with 8cc/kg -Obtain ABG in afternoon, will adjust as needed -Plan to wean ventilatory support as the patient stabilizes -Daily SAT/SBT -PAD bundle as discussed above -initiate VAP prevention measures  Acute on Chronic Kidney disease, stage IV Anemia of Chronic Disease At risk for need of renal replacement therapy. Admission creat 1.9, today 2.65 -Keep MAP above 65 for renal perfusion -Will continue to monitor urine output -Will place HD cath if needed for CRRT -Transfuse at 7 (per cardiology) -Will  concentrate levophed to reduce IV intake  Type II DM -Insulin therapy per Cardiology -Will continue to monitor     Best practice (evaluated daily)  Diet:  NPO Pain/Anxiety/Delirium protocol (if indicated): Yes (RASS goal -1), Fentanyl, Precedex VAP protocol (if indicated): Yes DVT prophylaxis: LMWH  GI prophylaxis: PPI Glucose control:  SSI Yes Central venous access:  Yes, and it is still needed Arterial line:  N/A Foley:  Yes, and it is still needed Mobility:  bed rest  PT consulted: N/A Last date of multidisciplinary goals of care discussion [Per primary, consent obtained from Son for Aline and CVC on 03/24] Code Status:  full code Disposition: ICU  Labs   CBC: Recent Labs  Lab 09/27/20 0617 09/28/20 0500 09/29/20 0439 09/30/20 0518 09/30/20 2000 10/01/20 0447 10/01/20 0829 10/01/20 0841  WBC 9.3  --  8.3 8.0  --  10.2  --  11.6*  HGB 7.4*   < > 7.9* 7.0* 8.4* 8.5* 8.2* 8.2*  HCT 23.5*   < > 25.3* 22.8* 26.1* 26.3* 24.0* 27.2*  MCV 92.9  --  93.4 94.6  --  89.2  --  94.1  PLT 381  --  398 404*  --  451*  --  501*   < > = values in this interval not displayed.    Basic Metabolic Panel: Recent Labs  Lab 09/18/2020 0500 09/17/2020 2355 09/27/20 0617 09/28/20 0500 09/29/20 0439 09/30/20 0518 10/01/20 0447 10/01/20  0829  NA  --    < > 134* 136 137 136 135 132*  K  --    < > 3.7 3.6 3.7 3.9 4.5 4.5  CL  --    < > 105 104 103 103 103  --   CO2  --    < > 22 24 24 23  18*  --   GLUCOSE  --    < > 257* 171* 154* 230* 251*  --   BUN  --    < > 59* 48* 44* 47* 50*  --   CREATININE  --    < > 2.23* 2.06* 2.21* 2.37* 2.65*  --   CALCIUM  --    < > 7.7* 7.8* 8.3* 8.2* 8.5*  --   MG 2.1  --   --   --  2.0  --   --   --    < > = values in this interval not displayed.   GFR: Estimated Creatinine Clearance: 17.3 mL/min (A) (by C-G formula based on SCr of 2.65 mg/dL (H)). Recent Labs  Lab 09/29/20 0439 09/30/20 0518 10/01/20 0447 10/01/20 0841  WBC 8.3 8.0 10.2  11.6*  LATICACIDVEN  --   --   --  9.2*    Liver Function Tests: Recent Labs  Lab 09/28/20 0500  AST 26  ALT 15  ALKPHOS 78  BILITOT 0.9  PROT 4.9*  ALBUMIN 1.8*   No results for input(s): LIPASE, AMYLASE in the last 168 hours. No results for input(s): AMMONIA in the last 168 hours.  ABG    Component Value Date/Time   PHART 7.327 (L) 10/01/2020 0829   PCO2ART 21.3 (L) 10/01/2020 0829   PO2ART 122 (H) 10/01/2020 0829   HCO3 11.1 (L) 10/01/2020 0829   TCO2 12 (L) 10/01/2020 0829   ACIDBASEDEF 13.0 (H) 10/01/2020 0829   O2SAT 99.0 10/01/2020 0829     Coagulation Profile: Recent Labs  Lab 09/23/2020 0400  INR 1.0    Cardiac Enzymes: No results for input(s): CKTOTAL, CKMB, CKMBINDEX, TROPONINI in the last 168 hours.  HbA1C: Hgb A1c MFr Bld  Date/Time Value Ref Range Status  09/30/2020 01:43 PM 9.3 (H) 4.8 - 5.6 % Final    Comment:    (NOTE) Pre diabetes:          5.7%-6.4%  Diabetes:              >6.4%  Glycemic control for   <7.0% adults with diabetes     CBG: Recent Labs  Lab 09/30/20 0742 09/30/20 1154 09/30/20 1639 09/30/20 2018 10/01/20 0814  GLUCAP 247* 192* 294* 143* 282*    Review of Systems:   Unable to obtain a review of systems due to patient status  Past Medical History:  She,  has a past medical history of CHF (congestive heart failure) ([place]), Diabetes mellitus without complication ([place]), Hypertension, and Stroke ([place]) (2018).   Surgical History:   Past Surgical History:  Procedure Laterality Date  . CAROTID ENDARTERECTOMY    . RIGHT/LEFT HEART CATH AND CORONARY ANGIOGRAPHY N/A 09/16/2020   Procedure: RIGHT/LEFT HEART CATH AND CORONARY ANGIOGRAPHY;  Surgeon: Sherren Mocha, MD;  Location: [facility];  Service: Cardiovascular;  Laterality: N/A;     Social History:   reports that she has been smoking cigarettes. She started smoking about 50 years ago. She has been smoking about 0.50 packs per day. She has never used  smokeless tobacco. She reports previous alcohol use. She reports  that she does not use drugs.   Family History:  Her family history is not on file.   Allergies Allergies  Allergen Reactions  . Contrast Media [Iodinated Diagnostic Agents]   . Naproxen Nausea And Vomiting     Home Medications  Prior to Admission medications   Medication Sig Start Date End Date Taking? Authorizing Provider  amLODipine (NORVASC) 10 MG tablet Take 10 mg by mouth daily.   Yes [provider]  aspirin EC 81 MG tablet Take 81 mg by mouth daily. Swallow whole.   Yes [provider]  cholecalciferol (VITAMIN D3) 25 MCG (1000 UNIT) tablet Take 1,000 Units by mouth daily. 3 times weekly.  Sunday,Tuesda & Friday   Yes [provider]  clopidogrel (PLAVIX) 75 MG tablet Take 75 mg by mouth daily.   Yes [provider]  ezetimibe (ZETIA) 10 MG tablet Take 10 mg by mouth daily.   Yes [provider]  glipiZIDE (GLUCOTROL XL) 10 MG 24 hr tablet Take 10 mg by mouth 2 (two) times daily.   Yes [provider]  Multiple Vitamins-Minerals (MULTIVITAMIN WITH MINERALS) tablet Take 1 tablet by mouth daily.   Yes [provider]  pravastatin (PRAVACHOL) 40 MG tablet Take 40 mg by mouth daily.   Yes [provider]  SitaGLIPtin-MetFORMIN HCl (JANUMET XR) [id number] MG TB24 Take 0.5 tablets by mouth daily.   Yes [provider]     Critical care time: 44 minutes    Redmond School., MSN, APRN, AGACNP-BC [facility]  10/01/2020 , 1:22 PM   Please see [email or web address] for pager details  From 7a-7p if no response, please call [phone] After hours, please call Elink at [phone]   PCCM:  Please see my separate progress note for attestation and medical decision making.   [facility] 10/01/2020 3:48 PM

## 2020-10-02 ENCOUNTER — Encounter (HOSPITAL_COMMUNITY): Admission: EM | Disposition: E | Payer: Self-pay | Source: Home / Self Care | Attending: Internal Medicine

## 2020-10-02 ENCOUNTER — Other Ambulatory Visit: Payer: Self-pay

## 2020-10-02 DIAGNOSIS — I5023 Acute on chronic systolic (congestive) heart failure: Secondary | ICD-10-CM | POA: Diagnosis not present

## 2020-10-02 DIAGNOSIS — I471 Supraventricular tachycardia: Secondary | ICD-10-CM

## 2020-10-02 DIAGNOSIS — I998 Other disorder of circulatory system: Secondary | ICD-10-CM

## 2020-10-02 DIAGNOSIS — R570 Cardiogenic shock: Secondary | ICD-10-CM | POA: Diagnosis not present

## 2020-10-02 DIAGNOSIS — I2510 Atherosclerotic heart disease of native coronary artery without angina pectoris: Secondary | ICD-10-CM | POA: Diagnosis not present

## 2020-10-02 DIAGNOSIS — I350 Nonrheumatic aortic (valve) stenosis: Secondary | ICD-10-CM | POA: Diagnosis not present

## 2020-10-02 LAB — BASIC METABOLIC PANEL
Anion gap: 12 (ref 5–15)
Anion gap: 14 (ref 5–15)
BUN: 56 mg/dL — ABNORMAL HIGH (ref 8–23)
BUN: 55 mg/dL — ABNORMAL HIGH (ref 8–23)
CO2: 22 mmol/L (ref 22–32)
CO2: 21 mmol/L — ABNORMAL LOW (ref 22–32)
Calcium: 8.1 mg/dL — ABNORMAL LOW (ref 8.9–10.3)
Calcium: 8.1 mg/dL — ABNORMAL LOW (ref 8.9–10.3)
Chloride: 104 mmol/L (ref 98–111)
Chloride: 100 mmol/L (ref 98–111)
Creatinine, Ser: 2.92 mg/dL — ABNORMAL HIGH (ref 0.44–1.00)
Creatinine, Ser: 3 mg/dL — ABNORMAL HIGH (ref 0.44–1.00)
GFR, Estimated: 17 mL/min — ABNORMAL LOW (ref >60)
GFR, Estimated: 16 mL/min — ABNORMAL LOW (ref >60)
Glucose, Bld: 86 mg/dL (ref 70–99)
Glucose, Bld: 224 mg/dL — ABNORMAL HIGH (ref 70–99)
Potassium: 3.9 mmol/L (ref 3.5–5.1)
Potassium: 4.2 mmol/L (ref 3.5–5.1)
Sodium: 138 mmol/L (ref 135–145)
Sodium: 135 mmol/L (ref 135–145)

## 2020-10-02 LAB — POCT I-STAT 7, (LYTES, BLD GAS, ICA,H+H)
Acid-Base Excess: 4 mmol/L — ABNORMAL HIGH (ref 0.0–2.0)
Acid-Base Excess: 5 mmol/L — ABNORMAL HIGH (ref 0.0–2.0)
Acid-Base Excess: 2 mmol/L (ref 0.0–2.0)
Bicarbonate: 23.9 mmol/L (ref 20.0–28.0)
Bicarbonate: 25.3 mmol/L (ref 20.0–28.0)
Bicarbonate: 25.9 mmol/L (ref 20.0–28.0)
Calcium, Ion: 1.08 mmol/L — ABNORMAL LOW (ref 1.15–1.40)
Calcium, Ion: 1.1 mmol/L — ABNORMAL LOW (ref 1.15–1.40)
Calcium, Ion: 1.15 mmol/L (ref 1.15–1.40)
HCT: 24 % — ABNORMAL LOW (ref 36.0–46.0)
HCT: 25 % — ABNORMAL LOW (ref 36.0–46.0)
HCT: 25 % — ABNORMAL LOW (ref 36.0–46.0)
Hemoglobin: 8.2 g/dL — ABNORMAL LOW (ref 12.0–15.0)
Hemoglobin: 8.5 g/dL — ABNORMAL LOW (ref 12.0–15.0)
Hemoglobin: 8.5 g/dL — ABNORMAL LOW (ref 12.0–15.0)
O2 Saturation: 98 %
O2 Saturation: 98 %
O2 Saturation: 97 %
Patient temperature: 99
Patient temperature: 99
Patient temperature: 99
Potassium: 4 mmol/L (ref 3.5–5.1)
Potassium: 3.9 mmol/L (ref 3.5–5.1)
Potassium: 4 mmol/L (ref 3.5–5.1)
Sodium: 138 mmol/L (ref 135–145)
Sodium: 137 mmol/L (ref 135–145)
Sodium: 141 mmol/L (ref 135–145)
TCO2: 24 mmol/L (ref 22–32)
TCO2: 26 mmol/L (ref 22–32)
TCO2: 27 mmol/L (ref 22–32)
pCO2 arterial: 21.5 mmHg — ABNORMAL LOW (ref 32.0–48.0)
pCO2 arterial: 21.7 mmHg — ABNORMAL LOW (ref 32.0–48.0)
pCO2 arterial: 35.4 mmHg (ref 32.0–48.0)
pH, Arterial: 7.655 (ref 7.350–7.450)
pH, Arterial: 7.675 (ref 7.350–7.450)
pH, Arterial: 7.474 — ABNORMAL HIGH (ref 7.350–7.450)
pO2, Arterial: 76 mmHg — ABNORMAL LOW (ref 83.0–108.0)
pO2, Arterial: 83 mmHg (ref 83.0–108.0)
pO2, Arterial: 87 mmHg (ref 83.0–108.0)

## 2020-10-02 LAB — COOXEMETRY PANEL
Carboxyhemoglobin: 1.7 % — ABNORMAL HIGH (ref 0.5–1.5)
Carboxyhemoglobin: 1.5 % (ref 0.5–1.5)
Methemoglobin: 1.1 % (ref 0.0–1.5)
Methemoglobin: 0.7 % (ref 0.0–1.5)
O2 Saturation: 64.4 %
O2 Saturation: 63.6 %
Total hemoglobin: 8.3 g/dL — ABNORMAL LOW (ref 12.0–16.0)
Total hemoglobin: 9.2 g/dL — ABNORMAL LOW (ref 12.0–16.0)

## 2020-10-02 LAB — GLUCOSE, CAPILLARY
Glucose-Capillary: 102 mg/dL — ABNORMAL HIGH (ref 70–99)
Glucose-Capillary: 127 mg/dL — ABNORMAL HIGH (ref 70–99)
Glucose-Capillary: 163 mg/dL — ABNORMAL HIGH (ref 70–99)
Glucose-Capillary: 184 mg/dL — ABNORMAL HIGH (ref 70–99)
Glucose-Capillary: 188 mg/dL — ABNORMAL HIGH (ref 70–99)
Glucose-Capillary: 213 mg/dL — ABNORMAL HIGH (ref 70–99)
Glucose-Capillary: 81 mg/dL (ref 70–99)
Glucose-Capillary: 84 mg/dL (ref 70–99)
Glucose-Capillary: 94 mg/dL (ref 70–99)

## 2020-10-02 LAB — CBC
HCT: 26.3 % — ABNORMAL LOW (ref 36.0–46.0)
Hemoglobin: 8.3 g/dL — ABNORMAL LOW (ref 12.0–15.0)
MCH: 27.9 pg (ref 26.0–34.0)
MCHC: 31.6 g/dL (ref 30.0–36.0)
MCV: 88.3 fL (ref 80.0–100.0)
Platelets: 529 10*3/uL — ABNORMAL HIGH (ref 150–400)
RBC: 2.98 MIL/uL — ABNORMAL LOW (ref 3.87–5.11)
RDW: 18.6 % — ABNORMAL HIGH (ref 11.5–15.5)
WBC: 14.2 10*3/uL — ABNORMAL HIGH (ref 4.0–10.5)
nRBC: 0.7 % — ABNORMAL HIGH (ref 0.0–0.2)

## 2020-10-02 LAB — HEPATIC FUNCTION PANEL
ALT: 23 U/L (ref 0–44)
AST: 67 U/L — ABNORMAL HIGH (ref 15–41)
Albumin: 1.8 g/dL — ABNORMAL LOW (ref 3.5–5.0)
Alkaline Phosphatase: 88 U/L (ref 38–126)
Bilirubin, Direct: <0.1 mg/dL (ref 0.0–0.2)
Total Bilirubin: 0.6 mg/dL (ref 0.3–1.2)
Total Protein: 5.4 g/dL — ABNORMAL LOW (ref 6.5–8.1)

## 2020-10-02 LAB — URINE CULTURE: Culture: NO GROWTH

## 2020-10-02 LAB — TROPONIN I (HIGH SENSITIVITY)
Troponin I (High Sensitivity): 3823 ng/L (ref <18)
Troponin I (High Sensitivity): 3586 ng/L (ref <18)

## 2020-10-02 LAB — PROCALCITONIN: Procalcitonin: 0.77 ng/mL

## 2020-10-02 LAB — LACTIC ACID, PLASMA: Lactic Acid, Venous: 1 mmol/L (ref 0.5–1.9)

## 2020-10-02 SURGERY — CORONARY ARTERY BYPASS GRAFTING (CABG)
Anesthesia: General | Site: Chest

## 2020-10-02 MED ORDER — AMIODARONE LOAD VIA INFUSION
150.0000 mg | Freq: Once | INTRAVENOUS | Status: AC
  Filled 2020-10-02: qty 83.34

## 2020-10-02 MED ORDER — MORPHINE SULFATE (PF) 2 MG/ML IV SOLN
2.0000 mg | Freq: Once | INTRAVENOUS | Status: AC
  Administered 2020-10-02: 2 mg via INTRAVENOUS
  Filled 2020-10-02: qty 1

## 2020-10-02 MED ORDER — MAGNESIUM SULFATE 2 GM/50ML IV SOLN
INTRAVENOUS | Status: AC
  Administered 2020-10-02: 2 g via INTRAVENOUS
  Filled 2020-10-02: qty 50

## 2020-10-02 MED ORDER — FUROSEMIDE 10 MG/ML IJ SOLN
80.0000 mg | Freq: Once | INTRAMUSCULAR | Status: AC
  Administered 2020-10-02: 80 mg via INTRAVENOUS
  Filled 2020-10-02: qty 8

## 2020-10-02 MED ORDER — NALOXONE HCL 0.4 MG/ML IJ SOLN: 0.4000 mg | INTRAMUSCULAR | Status: DC | PRN

## 2020-10-02 MED ORDER — MORPHINE SULFATE (PF) 2 MG/ML IV SOLN
1.0000 mg | Freq: Once | INTRAVENOUS | Status: AC
  Administered 2020-10-02: 1 mg via INTRAVENOUS
  Filled 2020-10-02: qty 1

## 2020-10-02 MED ORDER — AMIODARONE LOAD VIA INFUSION
150.0000 mg | Freq: Once | INTRAVENOUS | Status: AC
  Administered 2020-10-02: 150 mg via INTRAVENOUS
  Filled 2020-10-02: qty 83.34

## 2020-10-02 MED ORDER — ETOMIDATE 2 MG/ML IV SOLN
INTRAVENOUS | Status: AC
  Administered 2020-10-02: 5 mg via INTRAVENOUS
  Filled 2020-10-02: qty 20

## 2020-10-02 MED ORDER — ETOMIDATE 2 MG/ML IV SOLN: 5.0000 mg | Freq: Once | INTRAVENOUS | Status: AC

## 2020-10-02 MED ORDER — ONDANSETRON HCL 4 MG/2ML IJ SOLN: 4.0000 mg | Freq: Four times a day (QID) | INTRAMUSCULAR | Status: DC | PRN

## 2020-10-02 MED ORDER — FENTANYL BOLUS VIA INFUSION: 50.0000 ug | Freq: Once | INTRAVENOUS | Status: DC

## 2020-10-02 MED ORDER — DIPHENHYDRAMINE HCL 50 MG/ML IJ SOLN: 12.5000 mg | Freq: Four times a day (QID) | INTRAMUSCULAR | Status: DC | PRN

## 2020-10-02 MED ORDER — FENTANYL CITRATE (PF) 100 MCG/2ML IJ SOLN
INTRAMUSCULAR | Status: AC
  Administered 2020-10-02: 50 ug
  Filled 2020-10-02: qty 2

## 2020-10-02 MED ORDER — ROCURONIUM BROMIDE 10 MG/ML (PF) SYRINGE
PREFILLED_SYRINGE | INTRAVENOUS | Status: AC
  Filled 2020-10-02: qty 10

## 2020-10-02 MED ORDER — HYDROMORPHONE 1 MG/ML IV SOLN
INTRAVENOUS | Status: DC
Start: 2020-10-02 — End: 2020-10-04
  Administered 2020-10-02: 30 mg via INTRAVENOUS
  Administered 2020-10-03: 1.25 mg via INTRAVENOUS
  Administered 2020-10-03: 1.5 mg via INTRAVENOUS
  Administered 2020-10-03: 0.25 mg via INTRAVENOUS
  Administered 2020-10-03: 1.2 mg via INTRAVENOUS
  Administered 2020-10-03: 3.8 mg via INTRAVENOUS
  Filled 2020-10-02: qty 30

## 2020-10-02 MED ORDER — MORPHINE BOLUS VIA INFUSION
1.0000 mg | Freq: Once | INTRAVENOUS | Status: DC
  Filled 2020-10-02: qty 1

## 2020-10-02 MED ORDER — MAGNESIUM SULFATE 2 GM/50ML IV SOLN: 2.0000 g | Freq: Once | INTRAVENOUS | Status: AC

## 2020-10-02 MED ORDER — MIDAZOLAM HCL 2 MG/2ML IJ SOLN
INTRAMUSCULAR | Status: AC
  Filled 2020-10-02: qty 2

## 2020-10-02 MED ORDER — MORPHINE SULFATE (PF) 4 MG/ML IV SOLN
4.0000 mg | Freq: Once | INTRAVENOUS | Status: AC
Start: 2020-10-02 — End: 2020-10-02
  Administered 2020-10-02: 4 mg via INTRAVENOUS
  Filled 2020-10-02: qty 1

## 2020-10-02 MED ORDER — SODIUM CHLORIDE 0.9% FLUSH: 9.0000 mL | INTRAVENOUS | Status: DC | PRN

## 2020-10-02 MED ORDER — AMIODARONE HCL IN DEXTROSE 360-4.14 MG/200ML-% IV SOLN
INTRAVENOUS | Status: AC
  Administered 2020-10-02: 150 mg via INTRAVENOUS
  Filled 2020-10-02: qty 200

## 2020-10-02 MED ORDER — DIPHENHYDRAMINE HCL 12.5 MG/5ML PO ELIX: 12.5000 mg | ORAL_SOLUTION | Freq: Four times a day (QID) | ORAL | Status: DC | PRN

## 2020-10-02 MED ORDER — AMIODARONE HCL IN DEXTROSE 360-4.14 MG/200ML-% IV SOLN
30.0000 mg/h | INTRAVENOUS | Status: DC
  Administered 2020-10-03: 30 mg/h via INTRAVENOUS
  Filled 2020-10-02 (×3): qty 200

## 2020-10-02 MED ORDER — AMIODARONE HCL IN DEXTROSE 360-4.14 MG/200ML-% IV SOLN
60.0000 mg/h | INTRAVENOUS | Status: AC
  Administered 2020-10-02: 60 mg/h via INTRAVENOUS

## 2020-10-02 NOTE — Progress Notes (Addendum)
NAMEVayla [name], MRN:  [id number], DOB:  04-12-1951, LOS: 12 ADMISSION DATE:  09/25/2020, CONSULTATION DATE:  03.24.2022 REFERRING MD: Buford Dresser, MD CHIEF COMPLAINT:  Acute Respiratory Failure with Hypoxemia  History of Present Illness:  Sara [name] is a 70 y.o. F with a past medical history of HFrEF (EF 40-45%), three-vessel CAD, aortic stenosis, HTN, DM2, PAD, CKD III-IV (admit creat 1.9), and prior CVA who presented to [facility] on 10/01/2020 due to worsening SOB while she was at home.She was found in the ED to have a type II NSTEMI, with elevated troponin, due to volume overload and hypoxemia and she was admitted to [facility].  Cardiology was consulted on 10/05/2020. They found that she was admitted in [place] in July of 2021 where she underwent a cardiac cath and MRI resulting in newly diagnosed with three-vessel CAD, was instructed to follow up with CTCS but was unable to follow up due to the COVID-19 pandemic. Her ECHO on 3/14  was notable for moderate aortic stenosis with an EF of 25 to 30%. Her right heart cath on 09/18/2020 was also notable for severe three vessel disease, moderate aortic stenosis, and a cardiac index of 5 while on milrinone. CTCS was consulted on 10/07/2020 for evaluation with plans to complete a CABG and AVR with Dr. Cyndia Bent however timing of the surgery was uncertain due to a rising creatinine.  The morning of 10/01/2020 Sara [name] decompensated becoming cold, confused, oliguric, and tachypnic. She had received ativan overnight and her symptoms did not improve with flumazenil. She was transferred to [facility] ICU, she was started on levophed for cardiogenic shock and possible acute renal failure. PCCM was consulted for assistance with her respiratory failure. She was intubated on 03/24 and remains critically ill on levophed and milrinone.  Pertinent  Medical History  HFrEF HTN Three-vessel CAD Moderate to Severe Aortic Stenosis PAD CKD III-IV Prior  CVA  Significant Hospital Events: Including procedures, antibiotic start and stop dates in addition to other pertinent events    Transfer to [facility]  ETT 03/24 >>   Femoral A Line 03/24 >>  Ceftriaxone 03/24 >>  Blood Culture 03/24 >>  Urine Culture 03/24 >> No growth  Interim History / Subjective:  +98ml, 495 UOP, Tmax 99 overnight, WBC increasing to 14.2, procal increased to .77  Levophed 15 mcg, Milrinone .25  Fentanyl/Precedex   Remains intubated, lactate downtrending  Unable to obtain subjective history due to patient status  Objective   Blood pressure 118/68, pulse 96, temperature 97.9 F (36.6 C), temperature source Axillary, resp. rate 20, height 5\' 2"  (1.575 m), weight 61 kg, SpO2 100 %. CVP:  [5 mmHg-26 mmHg] 10 mmHg  Vent Mode: PSV;CPAP FiO2 (%):  [30 %-70 %] 30 % Set Rate:  [16 bmp-30 bmp] 16 bmp Vt Set:  [400 mL] 400 mL PEEP:  [5 cmH20] 5 cmH20 Pressure Support:  [10 cmH20] 10 cmH20 Plateau Pressure:  [15 cmH20-18 cmH20] 15 cmH20   Intake/Output Summary (Last 24 hours) at 10/07/2020 1101 Last data filed at 10/01/2020 1000 Gross per 24 hour  Intake 1245.54 ml  Output 520 ml  Net 725.54 ml   Filed Weights   09/30/20 0500 10/01/20 0500 10/02/20 0500  Weight: 61.5 kg 61.6 kg 61 kg    Examination: General: In bed, tracking, interactive HENT: Trachea midline, anicteric, ETT in place, OGT in place Lungs: Bilateral breath sounds, clear secretions, chest expansion symmetric Cardiovascular: X5Q0, NSR, systolic murmur present, no rubs or gallops, CVP 10 Abdomen: soft,  nontender, bowel sounds present Extremities: RT foot colder than L foot, cap refill less than 3 seconds in upper extremities, arms and upper legs warm Neuro: GCS 11T, RASS 0, PERRL 3 GU: Foley in place, urine present, clear/yellow  Labs/imaging that I havepersonally reviewed   CBC, BMP, ABG, Hepatic function panel, Procal  Resolved Hospital Problem list   Lactic acidosis  Assessment &  Plan:  Sara [name] is a 70 y.o. female with a pertinent past medical history of HFrEF (EF 40-45%), three-vessel CAD, aortic stenosis, HTN, DM2, PAD, CKD III-IV, who was awaiting a AVR and CABG with CTCS. The evening of 3/24 she became confused, developed respiratory distress, and cardiogenic shock. She was transferred to on 3/25 to [place], where PCCM was consulted for assistance with respiratory failure and she was intubated.   Cardiogenic Shock, with Lactic Acidosis Acute on Chronic Systolic Heart Failure Ischemic Cardiomyopathy NSTEMI CAD Severe Aortic Stenosis PAD PVCs Acute on Chronic Kidney Disease, CKD stage IV hypoperfusion due to cardiogenic shock in the setting of cardiac disease, CVP 10, lactic acid improving 9.4 to 4.5 to 1.0 (3/25) -Continue following CVP plus coox -Monitor urine output closely -Goal map greater than 65, titrate levophed to goal -Continue milrinone -Continue ASA, Statin -Monitor electrolytes, replace as needed -Avoid nephrotoxic medications as possible  Acute Metabolic Encephalopathy Likely due to hypoperfusion from cardiogenic shock, exam improved today -Continue PAD bundle, wean sedation with RASS goal 0 -Continue delirium prevention measures -Goal MAP greater than 65, titrate levophed to goal  Acute Hypoxemic Respiratory Failure, Requiring Intubation and Mechanical Ventilation In the setting of cardiogenic shock and acute metabolic encephalopathy, lactic acidosis improving -SAT/SBT today -Switch to spontaneous mode of ventilation, if tolerated move to extubation - Per HF team, 80 furosemide one hour prior to extubation -Wean precedex/ fentanyl to goal rass of 0 -Continue VAP prevention measures -If extubated, pulmonary hygiene with DB, Cough, and IS -BiPAP post extubation in the setting of respiratory distress  Acute on Chronic Kidney disease, stage IV Anemia of Chronic Disease At risk for need of renal replacement therapy. Admission creat  1.9, today 2.92 -Keep MAP greater than 65 for renal perfusion -Continue to monitor UOP -PCCM can place HD cath if needed -Continue concetrated levophed for goal map -Minimize nephrotoxic meds as possible -Transfuse per cards at 7  Leukocytosis, post cardiogenic shock -Continue to monitor WBC -Procal 0.77 from <.10 (3/24), continue to monitor and trend -Continue empiric rocephin -Follow up blood cultures  Elevated AST Low flow state from cardiogenic shock -Consider monitoring CMP  Type II DM Hypoglycemia -Insulin therapy per Cards -Continue to monitor   Best practice (evaluated daily)  Diet:  NPO Pain/Anxiety/Delirium protocol (if indicated): Yes (RASS goal 0) VAP protocol (if indicated): Yes DVT prophylaxis: LMWH GI prophylaxis: PPI Glucose control:  SSI Yes Central venous access:  Yes, and it is still needed Arterial line:  Yes, and it is still needed Foley:  Yes, and it is still needed Mobility:  bed rest  PT consulted: N/A Last date of multidisciplinary goals of care discussion [per primary] Code Status:  full code Disposition: ICU  Labs   CBC: Recent Labs  Lab 09/29/20 0439 09/30/20 0518 09/30/20 2000 10/01/20 0447 10/01/20 0829 10/01/20 0841 10/01/20 1212 10/08/2020 0329 10/06/2020 0340 10/01/2020 0539  WBC 8.3 8.0  --  10.2  --  11.6*  --  14.2*  --   --   HGB 7.9* 7.0*   < > 8.5*   < > 8.2* 9.5*  8.3*  8.2* 8.5* 8.5*  HCT 25.3* 22.8*   < > 26.3*   < > 27.2* 28.0* 26.3*  24.0* 25.0* 25.0*  MCV 93.4 94.6  --  89.2  --  94.1  --  88.3  --   --   PLT 398 404*  --  451*  --  501*  --  529*  --   --    < > = values in this interval not displayed.    Basic Metabolic Panel: Recent Labs  Lab 09/29/20 0439 09/30/20 0518 10/01/20 0447 10/01/20 0829 10/01/20 1345 10/01/20 2048 10/04/2020 0329 09/13/2020 0340 09/28/2020 0539  NA 137 136 135   < > 136 137 138  138 137 141  K 3.7 3.9 4.5   < > 4.4 3.8 3.9  4.0 3.9 4.0  CL 103 103 103  --  99 103 104  --   --    CO2 24 23 18*  --  21* 23 22  --   --   GLUCOSE 154* 230* 251*  --  412* 49* 86  --   --   BUN 44* 47* 50*  --  55* 56* 56*  --   --   CREATININE 2.21* 2.37* 2.65*  --  2.85* 2.82* 2.92*  --   --   CALCIUM 8.3* 8.2* 8.5*  --  8.0* 8.4* 8.1*  --   --   MG 2.0  --   --   --   --  2.3  --   --   --    < > = values in this interval not displayed.   GFR: Estimated Creatinine Clearance: 15.6 mL/min (A) (by C-G formula based on SCr of 2.92 mg/dL (H)). Recent Labs  Lab 09/30/20 0518 10/01/20 0447 10/01/20 0841 10/01/20 1025 10/01/20 1345 09/10/2020 0329 10/07/2020 0900  PROCALCITON  --   --  <0.10  --   --  0.77  --   WBC 8.0 10.2 11.6*  --   --  14.2*  --   LATICACIDVEN  --   --  9.2* 9.4* 4.5*  --  1.0    Liver Function Tests: Recent Labs  Lab 09/28/20 0500 09/22/2020 0329  AST 26 67*  ALT 15 23  ALKPHOS 78 88  BILITOT 0.9 0.6  PROT 4.9* 5.4*  ALBUMIN 1.8* 1.8*   No results for input(s): LIPASE, AMYLASE in the last 168 hours. No results for input(s): AMMONIA in the last 168 hours.  ABG    Component Value Date/Time   PHART 7.474 (H) 09/21/2020 0539   PCO2ART 35.4 09/23/2020 0539   PO2ART 87 10/04/2020 0539   HCO3 25.9 10/05/2020 0539   TCO2 27 10/01/2020 0539   ACIDBASEDEF 8.0 (H) 10/01/2020 1212   O2SAT 97.0 10/01/2020 0539     Coagulation Profile: No results for input(s): INR, PROTIME in the last 168 hours.  Cardiac Enzymes: No results for input(s): CKTOTAL, CKMB, CKMBINDEX, TROPONINI in the last 168 hours.  HbA1C: Hgb A1c MFr Bld  Date/Time Value Ref Range Status  09/28/2020 01:43 PM 9.3 (H) 4.8 - 5.6 % Final    Comment:    (NOTE) Pre diabetes:          5.7%-6.4%  Diabetes:              >6.4%  Glycemic control for   <7.0% adults with diabetes     CBG: Recent Labs  Lab 10/01/20 2322 10/01/2020 0042 09/28/2020 0203 10/02/20 0325 10/02/20 0735  GLUCAP 77 84 81 94 102*     Critical care time: 50    Redmond School., MSN, APRN,  AGACNP-BC [facility]  09/28/2020 , 11:54 AM   Please see [email or web address] for pager details  From 7a-7p if no response, please call [phone] After hours, please call Elink at [phone]   PCCM:  Please see progress note from this morning.   Garner Nash, DO [facility] 09/10/2020 1:20 PM

## 2020-10-02 NOTE — Progress Notes (Signed)
7 Days Post-Op Procedure(s) (LRB): RIGHT/LEFT HEART CATH AND CORONARY ANGIOGRAPHY (N/A) Subjective:  Events of yesterday reviewed. She developed acute respiratory failure with severe acidosis and required intubation and transfer to ICU. Started on [place]. Lactic acid 9.2. CXR showed increased pulmonary edema. She has been stable overnight but on milrinone 0.25, NE 15. Co-ox 64 this am.  Objective: Vital signs in last 24 hours: Temp:  [97.6 F (36.4 C)-99 F (37.2 C)] 99 F (37.2 C) (03/25 0355) Pulse Rate:  [31-232] 101 (03/25 0802) Cardiac Rhythm: Sinus tachycardia (03/25 0400) Resp:  [0-43] 26 (03/25 0802) BP: (88-135)/(48-87) 133/57 (03/25 0802) SpO2:  [97 %-100 %] 100 % (03/25 0802) Arterial Line BP: (115-145)/(43-68) 138/65 (03/25 0600) FiO2 (%):  [30 %-100 %] 30 % (03/25 0802) Weight:  [61 kg] 61 kg (03/25 0500)  Hemodynamic parameters for last 24 hours: CVP:  [5 mmHg-26 mmHg] 20 mmHg  Intake/Output from previous day: 03/24 0701 - 03/25 0700 In: 1354.8 [I.V.:1254.8; IV Piggyback:100] Out: 495 [Urine:495] Intake/Output this shift: No intake/output data recorded.  PE:  Intubated on vent Awake, follows commands RRR, no murmur Lungs clear Ext: no edema. Lab Results: Recent Labs    10/01/20 0841 10/01/20 1212 09/27/2020 0329 09/17/2020 0340 09/22/2020 0539  WBC 11.6*  --  14.2*  --   --   HGB 8.2*   < > 8.3*  8.2* 8.5* 8.5*  HCT 27.2*   < > 26.3*  24.0* 25.0* 25.0*  PLT 501*  --  529*  --   --    < > = values in this interval not displayed.   BMET:  Recent Labs    10/01/20 2048 09/10/2020 0329 09/27/2020 0340 09/14/2020 0539  NA 137 138  138 137 141  K 3.8 3.9  4.0 3.9 4.0  CL 103 104  --   --   CO2 23 22  --   --   GLUCOSE 49* 86  --   --   BUN 56* 56*  --   --   CREATININE 2.82* 2.92*  --   --   CALCIUM 8.4* 8.1*  --   --     PT/INR: No results for input(s): LABPROT, INR in the last 72 hours. ABG    Component Value Date/Time   PHART 7.474 (H) 09/23/2020  0539   HCO3 25.9 09/23/2020 0539   TCO2 27 09/09/2020 0539   ACIDBASEDEF 8.0 (H) 10/01/2020 1212   O2SAT 97.0 09/12/2020 0539   CBG (last 3)  Recent Labs    09/18/2020 0203 10/01/2020 0325 10/02/20 0735  GLUCAP 81 94 102*    Assessment/Plan:  Severe multivessel CAD s/p NSTEMI Moderate to severe aortic stenosis. May be low gradient, low EF severe AS with low SVI of 25. There is some mobility in aortic valve. Severe LV systolic dysfunction with EF 25-30 Stage IV CKD with creat 1.9 on admission. Increased to 2.5 with diuresis, started decreasing and now going back up 2.92 today Severe malnutrition with albumin 1.8, prealbumin 15. Acute on chronic anemia  Ongoing smoking with likely COPD. Acute hypoxic respiratory failure and cardiogenic shock yesterday requiring intubation.  This patient was a marginal high risk surgical candidate before this event due to the above risk factors and worsening renal failure. After this event I don't think that she would get through AVR/CABG and make a functional recovery. She would likely end up on CRRT, long term dialysis with respiratory failure, failure to thrive. I suspect that her coronary disease may be the  greatest of multiple contributors to her problems yesterday with very tight LAD. You could discuss with interventionalist to see if PCI even an option although she has calcified lesions. TAVR at some point could potentially be an option but she has not had CT scan workup and that is a problem with her renal failure. She could not have safe TAVR with her LAD stenosis. I think her prognosis overall is poor.  LOS: 12 days    Gaye Pollack 10/07/2020

## 2020-10-02 NOTE — Plan of Care (Signed)
  Problem: Education: Goal: Knowledge of General Education information will improve Description: Including pain rating scale, medication(s)/side effects and non-pharmacologic comfort measures Outcome: Progressing   Problem: Education: Goal: Ability to demonstrate management of disease process will improve Outcome: Progressing Goal: Ability to verbalize understanding of medication therapies will improve Outcome: Progressing Goal: Individualized Educational Video(s) Outcome: Progressing   Problem: Activity: Goal: Capacity to carry out activities will improve Outcome: Progressing   Problem: Cardiac: Goal: Ability to achieve and maintain adequate cardiopulmonary perfusion will improve Outcome: Progressing   Problem: Education: Goal: Ability to describe self-care measures that may prevent or decrease complications (Diabetes Survival Skills Education) will improve Outcome: Progressing Goal: Individualized Educational Video(s) Outcome: Progressing   Problem: Coping: Goal: Ability to adjust to condition or change in health will improve Outcome: Progressing

## 2020-10-02 NOTE — Progress Notes (Signed)
Patient resting comfortably on 2L [place]. No respiratory distress noted. BIPAP not needed at this time. RT will monitor as needed. 

## 2020-10-02 NOTE — Evaluation (Signed)
Clinical/Bedside Swallow Evaluation Patient Details  Name: Sara [name] MRN: [id number] Date of Birth: Jun 19, 1951  Today's Date: 09/17/2020 Time: SLP Start Time (ACUTE ONLY): 7628 SLP Stop Time (ACUTE ONLY): 1530 SLP Time Calculation (min) (ACUTE ONLY): 32 min  Past Medical History:  Past Medical History:  Diagnosis Date  . CHF (congestive heart failure) ([place])   . Diabetes mellitus without complication ([place])   . Hypertension   . Stroke [facility]) 2018   Reports right sided stroke, continues to have some slurred speech, but no motor defecients    Past Surgical History:  Past Surgical History:  Procedure Laterality Date  . CAROTID ENDARTERECTOMY    . RIGHT/LEFT HEART CATH AND CORONARY ANGIOGRAPHY N/A 09/28/2020   Procedure: RIGHT/LEFT HEART CATH AND CORONARY ANGIOGRAPHY;  Surgeon: Sherren Mocha, MD;  Location: [facility];  Service: Cardiovascular;  Laterality: N/A;   HPI:  Pt is a 70 y.o. female with acute on chronic CHF with ischemic cardiomyopathy, admitted with NSTEMI. Recent acute hypoxic respiratory failure requiring intubation from 3/24 until 3/25. CXR 3/24: Increasing changes of CHF. Some persistent right basilar airspace opacity is noted likely representing superimposed infiltrate. PMH: CVA, CHF, HTN, DM.   Assessment / Plan / Recommendation Clinical Impression  No overt s/s of aspiration were noted with thin liquids; however, intermittent throat clearing occured with solids. Pt also demonstrated prolonged mastication and increased effort masticating more solid textures which may be due to missing dentention. Pt has dentures present in her room and reports eating solids that were softer PTA. Recommend DYS 2 diet and thin liquids. SLP will continue to follow to assess her oropharyngeal swallow with this diet.  SLP Visit Diagnosis: Dysphagia, unspecified (R13.10)    Aspiration Risk  Mild aspiration risk    Diet Recommendation Dysphagia 2 (Fine chop);Thin liquid   Liquid  Administration via: Cup;Straw Medication Administration: Whole meds with liquid Supervision: Patient able to self feed;Intermittent supervision to cue for compensatory strategies Compensations: Slow rate;Small sips/bites Postural Changes: Seated upright at 90 degrees    Other  Recommendations Oral Care Recommendations: Oral care BID   Follow up Recommendations  (tba)      Frequency and Duration min 1 x/week  1 week       Prognosis Prognosis for Safe Diet Advancement: Good      Swallow Study   General HPI: Pt is a 70 y.o. female with acute on chronic CHF with ischemic cardiomyopathy, admitted with NSTEMI. Recent acute hypoxic respiratory failure requiring intubation from 3/24 until 3/25. CXR 3/24: Increasing changes of CHF. Some persistent right basilar airspace opacity is noted likely representing superimposed infiltrate. PMH: CVA, CHF, HTN, DM. Type of Study: Bedside Swallow Evaluation Previous Swallow Assessment: None in chart Diet Prior to this Study: NPO Temperature Spikes Noted: No Respiratory Status: Nasal cannula History of Recent Intubation: Yes Length of Intubations (days): 1 days Date extubated: 09/15/2020 Behavior/Cognition: Alert;Cooperative;Pleasant mood Oral Cavity Assessment: Within Functional Limits Oral Care Completed by SLP: No Oral Cavity - Dentition: Missing dentition (Missing most bottom teeth, no top teeth at all) Vision: Functional for self-feeding Self-Feeding Abilities: Able to feed self Patient Positioning: Upright in bed Baseline Vocal Quality: Normal (Pt and son report her voice is different from baseline) Volitional Cough: Strong Volitional Swallow: Able to elicit    Oral/Motor/Sensory Function Overall Oral Motor/Sensory Function: Within functional limits   Ice Chips Ice chips: Within functional limits Presentation: Spoon   Thin Liquid Thin Liquid: Within functional limits Presentation: Cup;Self Fed;Spoon;Straw    Nectar  Thick Nectar Thick  Liquid: Not tested   Honey Thick Honey Thick Liquid: Not tested   Puree Puree: Impaired Presentation: Self Fed;Spoon Pharyngeal Phase Impairments: Throat Clearing - Delayed;Throat Clearing - Immediate   Solid     Solid: Impaired Presentation: Self Fed Oral Phase Functional Implications: Prolonged oral transit Pharyngeal Phase Impairments: Throat Clearing - Immediate      Jeanine Luz., SLP Student 09/13/2020,3:56 PM

## 2020-10-02 NOTE — Procedures (Signed)
Electrical Cardioversion Procedure Note Sara [name] [id number] 1951/05/30  Procedure: Electrical Cardioversion Indications:  SVT  Procedure Details Consent: Risks of procedure as well as the alternatives and risks of each were explained to the (patient/caregiver).  Consent for procedure obtained. Time Out: Verified patient identification, verified procedure, site/side was marked, verified correct patient position, special equipment/implants available, medications/allergies/relevent history reviewed, required imaging and test results available.  Performed  Patient placed on cardiac monitor, pulse oximetry, supplemental oxygen as necessary.  Sedation given: Etomidate per CCM Pacer pads placed anterior and posterior chest.  Cardioverted 1 time(s).  Cardioverted at 120J.  Evaluation Findings: Post procedure EKG shows: NSR Complications: None Patient did tolerate procedure well.   Sara [name] 09/29/2020, 4:44 PM

## 2020-10-02 NOTE — Progress Notes (Signed)
  Concern for ischemic RLE. Unable to palpate/doppler pulses.   Vascular surgery consulted at the request of Dr Aundra Dubin.   Amy Clegg NP-C  /4:48 PM

## 2020-10-02 NOTE — Progress Notes (Addendum)
Advanced Heart Failure Rounding Note  PCP-Cardiologist: Evalina Field, MD   Subjective:   3/24 Transferred to ICU for shock/respiratory failure. Started milrinone + norepi.  3/25 Intubated. FiO2 30%. On milrinone 0.25 mcg + norepi 14 mcg. CO-OX 64%.   Creatinine 2.8> 2.9   Awake on the vent.    Objective:   Weight Range: 61 kg Body mass index is 24.6 kg/m.   Vital Signs:   Temp:  [97.6 F (36.4 C)-99 F (37.2 C)] 99 F (37.2 C) (03/25 0355) Pulse Rate:  [31-232] 101 (03/25 0802) Resp:  [0-43] 26 (03/25 0802) BP: (88-135)/(48-87) 133/57 (03/25 0802) SpO2:  [97 %-100 %] 100 % (03/25 0802) Arterial Line BP: (115-145)/(43-68) 138/65 (03/25 0600) FiO2 (%):  [30 %-100 %] 30 % (03/25 0802) Weight:  [61 kg] 61 kg (03/25 0500) Last BM Date: 09/29/20  Weight change: Filed Weights   09/30/20 0500 10/01/20 0500 09/26/2020 0500  Weight: 61.5 kg 61.6 kg 61 kg    Intake/Output:   Intake/Output Summary (Last 24 hours) at 09/30/2020 0832 Last data filed at 09/08/2020 1610 Gross per 24 hour  Intake 1325.35 ml  Output 495 ml  Net 830.35 ml      Physical Exam   CVP 10-11  General:  Intubated HEENT:ETT Neck: Supple. JVP 10-11 . Carotids 2+ bilat; no bruits. No lymphadenopathy or thyromegaly appreciated. Cor: PMI nondisplaced. Regular rate & rhythm. No rubs, gallops or murmurs. Lungs: Clear Abdomen: Soft, nontender, nondistended. No hepatosplenomegaly. No bruits or masses. Good bowel sounds. Extremities: No cyanosis, clubbing, rash, edema. RUE PICC Neuro: Awake on the vent.   Telemetry   SR-ST   EKG    N/A   Labs    CBC Recent Labs    10/01/20 0841 10/01/20 1212 09/14/2020 0329 09/19/2020 0340 09/19/2020 0539  WBC 11.6*  --  14.2*  --   --   HGB 8.2*   < > 8.3*  8.2* 8.5* 8.5*  HCT 27.2*   < > 26.3*  24.0* 25.0* 25.0*  MCV 94.1  --  88.3  --   --   PLT 501*  --  529*  --   --    < > = values in this interval not displayed.   Basic Metabolic  Panel Recent Labs    10/01/20 2048 10/01/2020 0329 09/18/2020 0340 09/12/2020 0539  NA 137 138  138 137 141  K 3.8 3.9  4.0 3.9 4.0  CL 103 104  --   --   CO2 23 22  --   --   GLUCOSE 49* 86  --   --   BUN 56* 56*  --   --   CREATININE 2.82* 2.92*  --   --   CALCIUM 8.4* 8.1*  --   --   MG 2.3  --   --   --    Liver Function Tests No results for input(s): AST, ALT, ALKPHOS, BILITOT, PROT, ALBUMIN in the last 72 hours. No results for input(s): LIPASE, AMYLASE in the last 72 hours. Cardiac Enzymes No results for input(s): CKTOTAL, CKMB, CKMBINDEX, TROPONINI in the last 72 hours.  BNP: BNP (last 3 results) Recent Labs    10/01/2020 0704 10/01/20 0842  BNP 1,649.4* 3,088.2*    ProBNP (last 3 results) No results for input(s): PROBNP in the last 8760 hours.   D-Dimer No results for input(s): DDIMER in the last 72 hours. Hemoglobin A1C No results for input(s): HGBA1C in the last 72 hours. Fasting  Lipid Panel No results for input(s): CHOL, HDL, LDLCALC, TRIG, CHOLHDL, LDLDIRECT in the last 72 hours. Thyroid Function Tests No results for input(s): TSH, T4TOTAL, T3FREE, THYROIDAB in the last 72 hours.  Invalid input(s): FREET3  Other results:   Imaging    DG CHEST PORT 1 VIEW  Result Date: 10/01/2020 CLINICAL DATA:  Intubation EXAM: PORTABLE CHEST 1 VIEW COMPARISON:  Earlier today FINDINGS: Endotracheal tube with tip 1 cm above the carina. The enteric tube reaches the stomach. Right PICC with tip at the SVC. Cardiomegaly. Interstitial opacity and pleural effusions. No pneumothorax. IMPRESSION: 1. Endotracheal tube with tip 1 cm above the carina. 2. Mild improvement in pulmonary edema.  Layering pleural effusions. Electronically Signed   By: Monte Fantasia M.D.   On: 10/01/2020 09:15      Medications:     Scheduled Medications: . aspirin  81 mg Per Tube Daily  . atorvastatin  80 mg Per Tube Daily  . chlorhexidine gluconate (MEDLINE KIT)  15 mL Mouth Rinse BID  .  Chlorhexidine Gluconate Cloth  6 each Topical Daily  . docusate  100 mg Per Tube BID  . enoxaparin (LOVENOX) injection  30 mg Subcutaneous Q24H  . ezetimibe  10 mg Per Tube Daily  . fentaNYL (SUBLIMAZE) injection  25 mcg Intravenous Once  . insulin aspart  0-15 Units Subcutaneous QID  . insulin aspart  3 Units Subcutaneous QID  . insulin glargine  12 Units Subcutaneous Daily  . mouth rinse  15 mL Mouth Rinse 10 times per day  . pantoprazole (PROTONIX) IV  40 mg Intravenous Q24H  . polyethylene glycol  17 g Per Tube Daily     Infusions: . sodium chloride 10 mL/hr at 09/19/2020 0600  . cefTRIAXone (ROCEPHIN)  IV Stopped (10/01/20 1334)  . dexmedetomidine (PRECEDEX) IV infusion 0.6 mcg/kg/hr (09/13/2020 6767)  . fentaNYL infusion INTRAVENOUS 75 mcg/hr (10/08/2020 0600)  . milrinone 0.25 mcg/kg/min (10/08/2020 0600)  . norepinephrine (LEVOPHED) Adult infusion 15 mcg/min (09/23/2020 0600)     PRN Medications:  sodium chloride, fentaNYL, ondansetron (ZOFRAN) IV, sodium chloride flush, traZODone    Patient Profile   70 y.o.femalewith ahistory ofthreevessel CAD noted on cardiac catheterization in 01/2020, ischemic cardiomyopathy/chronic systolic CHF, stroke, severe PAD. hypertension, diabetes mellitus, CKD III-IV,admitted 03/44fr CHF andNSTEMI. Found to have severe low-gradient AS with EF 25-30%. Awaiting CABG/AVR with Dr. BCyndia Bent Has been on milrinone with co-ox in mid 513s  Assessment/Plan  1. Shock/lactic acidosis - suspect primarily cardiac but may also have septic component.  - continue NE 14 mcg and milrinone 0.25 mcg.  - On ceftriaxone.  - Lactic Acid 9.2>9.4>4.5 > pending.  - Procalcitonin 0.1 - Blood Cx - NGTD   2. Acute on chronic CHF with ischemic cardiomyopathy -> cardiogenic shock: -Echo performed 09/24/20 with LVEF at 25-30% with mildly dilated ventricle, severely calcified aortic valve with AVA 0.87 cm2 and DVI 0.31 consistent with moderate to severe aortic  stenosis>>she has known multi vessel CAD [place]LHC3/18/2022showed severe CAD and at least moderate aortic stenosis, she ultimately needs CABG with AVR. Evaluated by Dr. BCyndia Bentwith plans for CABG with AVR3/25/22 - continue NE + milrinone. CO-OX stable.  - CO-OX 64% - CVP 11. Given 80 mg IV lasix now prior to extubation.  - No bb for now. No ARNI/Spiro  3. Acute hypoxic respiratory failure - intubated 3/24 --> Give one dose of IV lasix then extubate later today.   -  CCM following.   4. Moderate to severe AS: -Echo as above with LVEF at 25-30% with mildly dilated ventricle, severely calcified aortic valve with AVA 0.87 cm2 and DVI 0.31 consistent with moderate to severe aortic stenosiswith plans forCABG/AVRwith Dr. Cardell Peach stable -Suspect low flow/low gradient severe AS - be careful with pre-load  5. NSTEMI with known multivessel CAD: -Found to have 3VD 01/2020>>s/p cMRI with viable myocardium with workup for CABG in [place] however never performed -HsT peak at 2156 this admission>>no recurrent chest pain -Continue ASA,statin and Zetia  -Plan for CABG/AVRif/when stable  6. Acute on chronic kidney disease stage IV:  -Baseline Cr seems to b 1.8 - 2.0  - Creatinine 2.6>2.9 today. Will follow support hemodynamically - May need CRRT - Unable to use ACEI/ARB/ARNI due to poor renal function    Length of Stay: 12  Darrick Grinder, NP  09/26/2020, 8:32 AM  Advanced Heart Failure Team Pager [phone] (M-F; 7a - 5p)  Please contact [facility] for night-coverage after hours (5p -7a ) and weekends on [email or web address]  Agree with above  Developed cardiogenic shock and respiratory failure with pulmonary edema yesterday. Lactate went to 9. Moved to ICU. Intubated. Now on NE 15 and milrinone 0.25. Awake on vent. Co-ox 64% Lactate 1.0. CVP 9-10  Awake on vent. Wanting tube out. Creatinine  1.8-2.0 -> 2.9 Making  some urine.   General:  Awake on vent  HEENT: normal + ETT Neck: supple. JVP to jaw. Carotids 2+ bilat; no bruits. No lymphadenopathy or thryomegaly appreciated. Cor: PMI nondisplaced. Regular rate & rhythm.2/6 AS Lungs: clear Abdomen: soft, nontender, nondistended. No hepatosplenomegaly. No bruits or masses. Good bowel sounds. Extremities: no cyanosis, clubbing, rash, edema Neuro: awake following commands  She remains tenuous but improved. D/w with CCM. She is at high risk for recurrent pulmonary edema with low EF and moderate-severe AS. Wil try to diurese a bit more prior to extubation later today.   I d/w Dr. Cyndia Bent and she will likely not be candidate for CABG/AVR. Will need to d/w IC team if any PCI targets - potentially mid LAD though she has severe 3v CAD.   CRITICAL CARE Performed by: Glori Bickers  Total critical care time: 35 minutes  Critical care time was exclusive of separately billable procedures and treating other patients.  Critical care was necessary to treat or prevent imminent or life-threatening deterioration.  Critical care was time spent personally by me (independent of midlevel providers or residents) on the following activities: development of treatment plan with patient and/or surrogate as well as nursing, discussions with consultants, evaluation of patient's response to treatment, examination of patient, obtaining history from patient or surrogate, ordering and performing treatments and interventions, ordering and review of laboratory studies, ordering and review of radiographic studies, pulse oximetry and re-evaluation of patient's condition.  Glori Bickers, MD  11:14 AM

## 2020-10-02 NOTE — Progress Notes (Addendum)
Paged by nurse for patient with severe RLE pain. Daughter at beside only wanted to talk with the vascular.   Order given for 1 gm of IV morphine.   Reviewed Dr. Mora Appl note who has updated the patient's son.   Addendum:  Patient with ongoing RLE pain. Discussed with palliative care  A. Smith NP who can not come to see the patient tonight due to closed hours (call in AM if need to request consult).   Discussed with Dr. Harrell Gave who was involved in patient care before heart failure team takeover. Will start PCA Dilaudid.  Updated to attending Dr. Sallyanne Kuster as well.

## 2020-10-02 NOTE — Progress Notes (Addendum)
eLink Physician-Brief Progress Note Patient Name: Sara [name] DOB: 05/24/1951 MRN: [id number]   Date of Service  10/01/2020  HPI/Events of Note  ABG: 7.67/ 21.7/ 83/ 25.3. VENT: TV 400, PEEP 5, Rate 30.  eICU Interventions  Rate reduced to 16, ABG at 5:30 a.m.        Frederik Pear 09/21/2020, 4:33 AM

## 2020-10-02 NOTE — Interval H&P Note (Signed)
History and Physical Interval Note:  10/01/2020 4:37 PM  Sara [name]  has presented today for surgery, with the diagnosis of CHF.  The various methods of treatment have been discussed with the patient and family. After consideration of risks, benefits and other options for treatment, the patient has consented to  Procedure(s): RIGHT/LEFT HEART CATH AND CORONARY ANGIOGRAPHY (N/A) as a surgical intervention.  The patient's history has been reviewed, patient examined, no change in status, stable for surgery.  I have reviewed the patient's chart and labs.  Questions were answered to the patient's satisfaction.     Aleesa Sweigert [employer]

## 2020-10-02 NOTE — Progress Notes (Addendum)
PCCM Attending:   70 year old female past medical history three-vessel coronary disease, cardiac catheterization in July 2021, ischemic cardiomyopathy, chronic systolic heart failure, history of stroke, severe peripheral arterial disease, hypertension, type 2 diabetes, CKD stage IV.  Patient was admitted on 10/01/2020 for acute on chronic systolic heart failure exacerbation with decompensation and NSTEMI.  Patient was found to have severe low gradient aortic stenosis with a reduced ejection fraction of 25 to 30%.  Patient was on the floor awaiting optimization medically for a CABG plus AVR by Dr. Cyndia Bent.  Patient was transferred to the intensive care unit on the morning of 10/01/2020 with decompensation and cardiogenic shock, severe metabolic acidosis and lactic acidosis of 9.2.  Pulmonary critical care was consulted recommendations management.  Ultimately patient was intubated stabilized on milrinone and norepinephrine.  This morning the patient is awake alert on mechanical life support critically ill.  However able to follow commands and tolerating SBT SAT.  BP (!) 133/57   Pulse (!) 101   Temp 99 F (37.2 C) (Axillary)   Resp (!) 26   Ht 5\' 2"  (1.575 m)   Wt 61 kg   SpO2 100%   BMI 24.60 kg/m   General: Elderly female respiratory failure on mechanical life support HEENT: Endotracheal tube in place Heart: Systolic murmur faint, A5-B9 difficult to auscultate over mechanical support Lungs: Bilateral mechanically ventilated breath sounds, diminished in the bases Abdomen: Mildly distended nontender to palpation Extremities: Cool to the touch improved from yesterday  Labs: Reviewed Lactic acid doses resolved Serum creatinine 2.92 Sodium 141  Assessment: Cardiogenic shock Acute on chronic systolic heart failure Ischemic cardiomyopathy Coronary artery disease Severe aortic stenosis Acute hypoxemic respiratory failure requiring mechanical ventilation Acute on chronic kidney disease,  CKD stage IV Anion gap metabolic acidosis Lactic acidosis, resolved  Plan: Continue cardiac and hemodynamic support with milrinone plus norepinephrine, goal mean arterial pressure greater than 65 mmHg Follow CVP plus coox  Follow urine output closely Remains on empiric antimicrobials Cultures pending Continue aspirin, statin SBT/SAT Consider liberation from mechanical support today. We will need to be observed closely, at risk for need of BiPAP post extubation  Multidisciplinary discussion at bedside with cardiothoracic surgery advanced heart failure service, Drs. Bartle and Bensimhon  This patient is critically ill with multiple organ system failure; which, requires frequent high complexity decision making, assessment, support, evaluation, and titration of therapies. This was completed through the application of advanced monitoring technologies and extensive interpretation of multiple databases. During this encounter critical care time was devoted to patient care services described in this note for 38 minutes.  [facility] 10/01/2020 10:14 AM

## 2020-10-02 NOTE — Progress Notes (Signed)
Inpatient Diabetes Program Recommendations  AACE/ADA: New Consensus Statement on Inpatient Glycemic Control (2015)  Target Ranges:  Prepandial:   less than 140 mg/dL      Peak postprandial:   less than 180 mg/dL (1-2 hours)      Critically ill patients:  140 - 180 mg/dL   Lab Results  Component Value Date   GLUCAP 102 (H) 09/23/2020   HGBA1C 9.3 (H) 09/18/2020    Review of Glycemic Control Results for Sara [name], Sara [name] (MRN [id number]) as of 10/01/2020 09:33  Ref. Range 10/01/2020 21:56 10/01/2020 22:22 10/01/2020 23:22 09/12/2020 00:42 09/10/2020 02:03 10/07/2020 03:25 09/16/2020 07:35  Glucose-Capillary Latest Ref Range: 70 - 99 mg/dL 41 (LL) 98 77 84 81 94 102 (H)   Current orders for Inpatient glycemic control: Lantus 12 units daily, Novolog 0-15 units QID, Novolog 3 units QID  Inpatient Diabetes Program Recommendations:     Please consider discontinuing Novolog 3 units QID unless tube feeds are to start.    Will continue to follow while inpatient.  Thank you, Reche Dixon, RN, BSN Diabetes Coordinator Inpatient Diabetes Program [phone] (team pager from 8a-5p)

## 2020-10-02 NOTE — Progress Notes (Addendum)
Son at bedside with patient after just finishing speech eval.  SVT on monitor.  EKG obtained, Darrick Grinder, NP at bedside.  Amiodarone bolus given and drip started.    1555- No change in SVT on monitor.  Darrick Grinder, NP at bedside, second Amiodarone bolus given.  Levophed titrated for low BP.  See EMR.  1615- Patient remains in SVT, lethargic, but answering questions.  Third amiodarone bolus given.  Cardiology at bedside.  Team aware of mottling to right leg and no pulse.    1630- Critical care team at bedside, patient prepped for cardioversion.  Dr. Aundra Dubin at bedside.  1640- Medications given per orders.    1643- Cardioversion x 1 with 120J successful.  HR back to ST with PVCs, rate 105. Critical Care Team at bedside.  1700- Patient c/o new onset right leg pain with c/o chest pain.    Savage, PA aware of troponin result.  No new orders at this time.    1800- Daughter at bedside, updated on pt's status.  Daughter wanting to speak with a physician regarding mother's plan of care.    75- Bhagat, PA paged for increased right leg pain.  Aware that daughter has many questions.    42- Dr. Luan Pulling at bedside updating daughter and family via phone about plan for no surgery on right leg at this time.  36- Dr. Sallyanne Kuster at bedside updating daughter on plan, spoke to questions regarding pain control and plan of care.  Orders received for dilaudid PCA.

## 2020-10-02 NOTE — Progress Notes (Signed)
PCCM:  Called to bedside for hypotension, shock, associated with refractory SVT s/p amiodarone. HF service would like to cardiovert. Decision was made for sedation prior to cardioversion. I was present at bedside with all available equipment for intubation. Timeout performed for intubation if necessary. Patient was sedated with 5mg  of etomidate and 11mcg of fentanyl. Successful cardioversion with 120J. Patient received about 2-3 mins of BVM rescue breaths before opening eyes spontaneously and following commands. Patient overall doing well. Post cardioversion now.   Patient with new left lower ext in ability to move toes. It has been cool to touch this entire time but now does not have dopplerable pulse. Decision was made for consult to vascular surgery. I spoke with Darrick Grinder, NP who will place consult.   This patient is critically ill with multiple organ system failure; which, requires frequent high complexity decision making, assessment, support, evaluation, and titration of therapies. This was completed through the application of advanced monitoring technologies and extensive interpretation of multiple databases. During this encounter critical care time was devoted to patient care services described in this note for 55 minutes.  [facility] 10/01/2020 5:02 PM

## 2020-10-02 NOTE — Procedures (Signed)
Extubation Procedure Note  Patient Details:   Name: Sara [name] DOB: 05-08-1951 MRN: [id number]   Airway Documentation:    Vent end date: 09/20/2020 Vent end time: 1018   Evaluation  O2 sats: stable throughout Complications: No apparent complications Patient did tolerate procedure well. Bilateral Breath Sounds: Clear,Diminished   Yes   Pt extubated to 4L [name]. Positive cuff leak noted, no stridor heard. Vitals stable throughout. RT will continue to monitor.   [name] 09/18/2020, 10:22 AM

## 2020-10-02 NOTE — Progress Notes (Signed)
Progress Note  Patient Name: Sara [name] Date of Encounter: 09/29/2020  [facility] Cardiologist: Evalina Field, MD   Subjective   Alert on precedex this AM, appears calm.  Inpatient Medications    Scheduled Meds: . aspirin  81 mg Per Tube Daily  . atorvastatin  80 mg Per Tube Daily  . chlorhexidine gluconate (MEDLINE KIT)  15 mL Mouth Rinse BID  . Chlorhexidine Gluconate Cloth  6 each Topical Daily  . docusate  100 mg Per Tube BID  . enoxaparin (LOVENOX) injection  30 mg Subcutaneous Q24H  . ezetimibe  10 mg Per Tube Daily  . fentaNYL (SUBLIMAZE) injection  25 mcg Intravenous Once  . insulin aspart  0-15 Units Subcutaneous QID  . insulin aspart  3 Units Subcutaneous QID  . insulin glargine  12 Units Subcutaneous Daily  . mouth rinse  15 mL Mouth Rinse 10 times per day  . pantoprazole (PROTONIX) IV  40 mg Intravenous Q24H  . polyethylene glycol  17 g Per Tube Daily   Continuous Infusions: . sodium chloride 10 mL/hr at 09/30/2020 0600  . cefTRIAXone (ROCEPHIN)  IV Stopped (10/01/20 1334)  . dexmedetomidine (PRECEDEX) IV infusion 0.6 mcg/kg/hr (09/24/2020 9811)  . fentaNYL infusion INTRAVENOUS 75 mcg/hr (09/15/2020 0600)  . milrinone 0.25 mcg/kg/min (10/08/2020 0600)  . norepinephrine (LEVOPHED) Adult infusion 15 mcg/min (09/24/2020 0600)   PRN Meds: sodium chloride, fentaNYL, ondansetron (ZOFRAN) IV, sodium chloride flush   Vital Signs    Vitals:   09/16/2020 0401 09/30/2020 0500 09/19/2020 0600 09/11/2020 0802  BP:  (!) 104/48 108/68 (!) 133/57  Pulse:  (!) 31 (!) 102 (!) 101  Resp:  16 20 (!) 26  Temp:      TempSrc:      SpO2: 100% 100% 100% 100%  Weight:  61 kg    Height:        Intake/Output Summary (Last 24 hours) at 09/26/2020 0939 Last data filed at 09/30/2020 9147 Gross per 24 hour  Intake 1270.15 ml  Output 360 ml  Net 910.15 ml   Last 3 Weights 09/18/2020 10/01/2020 09/30/2020  Weight (lbs) 134 lb 7.7 oz 135 lb 12.9 oz 135 lb 8.6 oz  Weight (kg) 61 kg 61.6  kg 61.48 kg      Telemetry    Sinus tachycardia with PVCs - Personally Reviewed  ECG    3/24 sinus tach with PVC - Personally Reviewed  Physical Exam   GEN: Intubated but alert  NECK: JVD at low neck at 60 degrees CARDIAC: regular rhythm, normal S1 and S2, no rubs or gallops. 8-2/9 systolic murmur. VASCULAR: lower extremities are warm on left, lukewarm on right RESPIRATORY:  Clear to auscultation without rales, wheezing or rhonchi in upper fields, diminished basilar lateral fields, on ventilator ABDOMEN: Soft, non-tender, non-distended MUSCULOSKELETAL:  Moves all 4 limbs independently SKIN: Warm and dry, no edema NEUROLOGIC:  No focal neuro deficits noted. PSYCHIATRIC:  Normal affect   Labs    High Sensitivity Troponin:   Recent Labs  Lab 09/21/2020 1343 09/17/2020 1608 09/18/2020 2210 09/21/20 0636 09/21/20 0832  TROPONINIHS 1,109* 1,413* 2,156* 1,643* 1,826*      Chemistry Recent Labs  Lab 09/28/20 0500 09/29/20 0439 10/01/20 1345 10/01/20 2048 09/17/2020 0329 09/29/2020 0340 09/26/2020 0539  NA 136   < > 136 137 138  138 137 141  K 3.6   < > 4.4 3.8 3.9  4.0 3.9 4.0  CL 104   < > 99 103 104  --   --  CO2 24   < > 21* 23 22  --   --   GLUCOSE 171*   < > 412* 49* 86  --   --   BUN 48*   < > 55* 56* 56*  --   --   CREATININE 2.06*   < > 2.85* 2.82* 2.92*  --   --   CALCIUM 7.8*   < > 8.0* 8.4* 8.1*  --   --   PROT 4.9*  --   --   --   --   --   --   ALBUMIN 1.8*  --   --   --   --   --   --   AST 26  --   --   --   --   --   --   ALT 15  --   --   --   --   --   --   ALKPHOS 78  --   --   --   --   --   --   BILITOT 0.9  --   --   --   --   --   --   GFRNONAA 26*   < > 17* 18* 17*  --   --   ANIONGAP 8   < > 16* 11 12  --   --    < > = values in this interval not displayed.     Hematology Recent Labs  Lab 10/01/20 0447 10/01/20 0829 10/01/20 0841 10/01/20 1212 10/05/2020 0329 09/17/2020 0340 10/06/2020 0539  WBC 10.2  --  11.6*  --  14.2*  --   --   RBC  2.95*  --  2.89*  --  2.98*  --   --   HGB 8.5*   < > 8.2*   < > 8.3*  8.2* 8.5* 8.5*  HCT 26.3*   < > 27.2*   < > 26.3*  24.0* 25.0* 25.0*  MCV 89.2  --  94.1  --  88.3  --   --   MCH 28.8  --  28.4  --  27.9  --   --   MCHC 32.3  --  30.1  --  31.6  --   --   RDW 18.7*  --  18.8*  --  18.6*  --   --   PLT 451*  --  501*  --  529*  --   --    < > = values in this interval not displayed.    BNP Recent Labs  Lab 10/01/20 0842  BNP 3,088.2*     DDimer No results for input(s): DDIMER in the last 168 hours.   Radiology    DG CHEST PORT 1 VIEW  Result Date: 10/01/2020 CLINICAL DATA:  Intubation EXAM: PORTABLE CHEST 1 VIEW COMPARISON:  Earlier today FINDINGS: Endotracheal tube with tip 1 cm above the carina. The enteric tube reaches the stomach. Right PICC with tip at the SVC. Cardiomegaly. Interstitial opacity and pleural effusions. No pneumothorax. IMPRESSION: 1. Endotracheal tube with tip 1 cm above the carina. 2. Mild improvement in pulmonary edema.  Layering pleural effusions. Electronically Signed   By: Monte Fantasia M.D.   On: 10/01/2020 09:15   DG CHEST PORT 1 VIEW  Result Date: 10/01/2020 CLINICAL DATA:  Difficulty breathing EXAM: PORTABLE CHEST 1 VIEW COMPARISON:  09/14/2020 FINDINGS: Cardiac shadow is enlarged but stable. Right-sided PICC line is now seen in satisfactory position at the  cavoatrial junction. Increasing vascular congestion is noted consistent with CHF with mild interstitial parenchymal edema identified. Persistent but improved right basilar infiltrate is seen. Small effusions are noted bilaterally. No bony abnormality is seen. IMPRESSION: Increasing changes of CHF. Some persistent right basilar airspace opacity is noted likely representing superimposed infiltrate. PICC line in satisfactory position. Electronically Signed   By: Inez Catalina M.D.   On: 10/01/2020 08:36    Cardiac Studies   Vascular studies 3.22.22 Summary:  Right Carotid: Velocities in the  right ICA are consistent with a 40-59%         stenosis.   Left Carotid: Velocities in the left ICA are consistent with a 1-39%  stenosis.        The ECA appears >50% stenosed.  Vertebrals: Bilateral vertebral arteries demonstrate antegrade flow.  Subclavians: Normal flow hemodynamics were seen in bilateral subclavian        arteries.   Right ABI: Resting right ankle-brachial index indicates severe right lower  extremity arterial disease. The right toe-brachial index is abnormal.  Left ABI: Resting left ankle-brachial index indicates moderate left lower  extremity arterial disease. The left toe-brachial index is abnormal.  Right Upper Extremity: Doppler waveforms decrease >50% with right radial  compression. Doppler waveform obliterate with right ulnar compression.  Left Upper Extremity: Doppler waveform obliterate with left radial  compression. Doppler waveform obliterate with left ulnar compression.   Cardiac Cath: 09/17/2020  Ost LAD lesion is 35% stenosed.  Prox LAD to Mid LAD lesion is 95% stenosed.  Lat 1st Diag lesion is 90% stenosed.  1st Diag-1 lesion is 90% stenosed.  1st Diag-2 lesion is 80% stenosed.  Prox Cx lesion is 75% stenosed.  1st Mrg lesion is 40% stenosed.  Mid Cx to Dist Cx lesion is 75% stenosed.  Prox RCA to Mid RCA lesion is 80% stenosed.  Dist RCA lesion is 50% stenosed.  RPDA lesion is 80% stenosed.  1st RPL lesion is 80% stenosed.  1. Severe three-vessel coronary artery disease:  Severe stenosis of the mid LAD/first diagonal with complex calcific disease  Severe stenosis of the proximal and distal circumflex  Severe stenosis of the mid RCA and the PDA/PLA  2. Hemodynamic findings consistent with probable moderate aortic stenosis, possibly with low flow component in the setting of severe LV dysfunction. Peak to peak gradient 16 mmHg, mean gradient 9 mmHg. Aortic valve crossed with a J-wire.  3. Preserved  cardiac output on milrinone with cardiac index 5 L/min/m, low right heart filling pressures, LVEDP 19 mmHg.  RA 2 RV 25/1 PA 29/10 mean 20 Pulmonary capillary wedge pressure 12 LVEDP 19  PA oxygen saturation 73% Ao oxygen saturation 97%  Cardiac output 8.24 L/min Cardiac index 5.0 L/min/m  Recommend: cardiac surgical evaluation for CABG/AVR. High risk candidate. Multidisciplinary heart team review.  Diagnostic Dominance: Right     Echo: 09/21/2020 1. Left ventricular ejection fraction, by estimation, is 25 to 30%. The  left ventricle has severely decreased function. The left ventricle  demonstrates regional wall motion abnormalities (see scoring  diagram/findings for description). The left  ventricular internal cavity size was mildly dilated. There is mild left  ventricular hypertrophy. Left ventricular diastolic parameters are  consistent with Grade II diastolic dysfunction (pseudonormalization).  There is severe akinesis of the left  ventricular, mid-apical anteroseptal wall and apical segment. There is  severe akinesis of the left ventricular, entire inferior wall.  2. Right ventricular systolic function is normal. The right ventricular  size is normal.  There is mildly elevated pulmonary artery systolic  pressure.  3. Left atrial size was mildly dilated.  4. The mitral valve is grossly normal. Trivial mitral valve  regurgitation.  5. The aortic valve is calcified. There is moderate calcification of the  aortic valve. There is moderate thickening of the aortic valve. Aortic  valve regurgitation is trivial. Mild aortic valve stenosis.  Aortic valve mean gradient measures 11.8 mmHg. Aortic  valve peak gradient measures 20.6 mmHg. Aortic valve area, by VTI measures  0.89 cm.   Echocardiogram 01/26/2020 (Care Everywhere): 1. Mild LV dysfunction. EF is in the 40-45% range. The pattern is  worrisome for CAD.  2. Mild calcific aortic stenosis.  Gradients are 20/11 mmHg.  3. Mild LVH _______________  Left Cardiac Catheterization 02/04/2020 (Care Everywhere): Mid LAD 70% stenosis, distal LCx 70% stenosis, mid RCA 80% stenosis, proximal RPDA 50% stenosis, left main unremarkable. _______________  Cardiac MRI 02/06/2020 (Care Everywhere): LVEF 51%, RVEF 67%, LV scar 4%, small subendocardial infarction of basal anterior/mid anterolateral wall (LAD or ramus), small subendocardial infarction of mid inferoseptum (RCA), dysfunctional myocardium in all 3 coronary artery distributions predominantly viable, mild-moderate AS (AVA 1.0cm2, peak gradient 55mHg)  Patient Profile     70y.o. female with a history of three vessel CAD noted on cardiac catheterization in 01/2020, ischemic cardiomyopathy/chronic systolic CHF, stroke, hypertension, diabetes mellitus, CKD III-IV,admitted 03/13f CHF andNSTEMI  Assessment & Plan    Cardiogenic shock Acute on chronic systolic and diastolic heart failure Ischemic cardiomyopathy Acute hypoxic respiratory failure/respiratory fatigue Lactic acidosis -lactate 9.2->9.4 -> 4.5 -> pending -most recent ABG 7.47/35/87 with bicarb 26. Hopeful for extubation later today. -coox starting AM 3/24 55 -> 69 -> 77 -> 76 ->64 -> 64 -CVP has been 5-10 -has made 500 cc urine (0.3 mg/kg/hr) in last 24 hours without diuretic. Cr 2.92 today. Trialing lasix 80 mg IV this AM and monitor response.  -appreciate Dr. BeHaroldine Lawsnd the advanced heart failure team -appreciate Dr. IcValeta Harmsnd PCCM team -continue aspirin, statin, ezetimibe for now -will check LFTs -blood cultures no growth at 24 hours -continue empiric antibiotics for now, will stop if no growth at 48 hours -remains tachycardic though improve, MAP >65 on levophed and milrinone  NSTEMI Severe obstructive CAD, multivessel Moderate to severe aortic stenosis, likely low flow-low gradient severe aortic stenosis -had been planned for CABG-AVR 3/25 with Dr. BaCyndia Bent but given decompensation this is on hold. Per note today, may not be a candidate for surgery. -continue aspirin -will continue atorvastatin and ezetimibe pending LFTs (concern for shock liver)  PAD, with mild-moderate carotid stenosis and moderate to severe bilateral LE PAD -was warm yesterday but cool today. With significant lower extremity PAD, need to be cautious about loss of perfusion from vasoconstriction/hypotension/low cardiac output -her left leg pulse is dopplerable, but concern is for right leg given severe PAD. I discussed this with patient's daughter and son 3/24. Will monitor closely  Acute kidney injury and possibly renal failure Chronic kidney disease, stage 4 anemia of chronic disease -has made 500 cc urine (0.3 mg/kg/hr) in last 24 hours without diuretic. Cr 2.92 today -will aim for diuresis with lasix today, but I have discussed possible need for dialysis with patient's son if she does not respond to medication -responded to 1 U PRBC 09/30/20, Hgb stable at 8.5 today  PVCs: -monitor electrolytes, most recent K 4.0, Mg 2.3  Type II diabetes: -SSI to NPO dosing -continue glargine, monitor   CRITICAL CARE Patient is critically  ill with multiple organ systems affected and requires high complexity decision making. Total critical care time: 50 minutes. This time includes gathering of history, evaluation of patient's response to treatment, examination of patient, review of laboratory and imaging studies, and coordination with consultants. Greater than 50% of time spent in direct patient care.  For questions or updates, please contact [place] Please consult [email or web address] for contact info under        Signed, Buford Dresser, MD  09/24/2020, 9:39 AM

## 2020-10-02 NOTE — Consult Note (Signed)
[facility]ASC[facility] SPECIALISTS OF   ASSESSMENT / PLAN: 70 y.o. female with non-salvageable ischemic right lower extremity. The only operation I can offer her is an above knee amputation. There is no urgent need for this at the moment. Given her comorbidity profile and the her current multisystem organ dysfunction, I think undergoing an anesthetic would be potentially fatal. Recommend transition to comfort measures only. If the family and primary team want to be more aggressive, we could consider above knee amputation once she is optimized from a medical standpoint. I spoke to the patient's son at length and recommended the above.  CHIEF COMPLAINT / REASON FOR CONSULTATION: ischemic right leg  HISTORY OF PRESENT ILLNESS: Sara [name] is a 70 y.o. female admitted 09/17/2020 for acute decompensation of ischemic heart failure, NSTEMI. She underwent cardiac catheterization 09/30/2020 which demonstrated severe three vessel disease. Echocardiogram revealed severe aortic stenosis and depressed EF (25%). She was initially being considered for a AVR and CABG, but suffered a clinical deterioration over the past several days. She developed acute respiratory failure with severe acidosis. She was transferred to the ICU and intubated. She required high-dose vasopressors to maintain her blood pressure. A right femoral arterial line was placed to guide resuscitation. After her decompensation, Dr. Cyndia Bent felt her a poor candidate for AVR/CABG. Since yesterday the team has not been able to find doppler signals in the pedal vessels. Earlier today she entered SVT and required cardioversion. Afterwards, the team noted her leg to be mottled. Vascular consultation was requested. The patient is awake and can answer some simple questions, but cannot offer much in the way of history.  Past Medical History:  Diagnosis Date  . CHF (congestive heart failure) ([place])   . Diabetes mellitus without complication ([place])   .  Hypertension   . Stroke [facility]) 2018   Reports right sided stroke, continues to have some slurred speech, but no motor defecients     Past Surgical History:  Procedure Laterality Date  . CAROTID ENDARTERECTOMY    . RIGHT/LEFT HEART CATH AND CORONARY ANGIOGRAPHY N/A 09/26/2020   Procedure: RIGHT/LEFT HEART CATH AND CORONARY ANGIOGRAPHY;  Surgeon: Sherren Mocha, MD;  Location: [place] [facility];  Service: Cardiovascular;  Laterality: N/A;    History reviewed. No pertinent family history.  Social History   Socioeconomic History  . Marital status: Single    Spouse name: Not on file  . Number of children: Not on file  . Years of education: Not on file  . Highest education level: Not on file  Occupational History  . Not on file  Tobacco Use  . Smoking status: Current Every Day Smoker    Packs/day: 0.50    Types: Cigarettes    Start date: 55  . Smokeless tobacco: Never Used  Substance and Sexual Activity  . Alcohol use: Not Currently  . Drug use: Never  . Sexual activity: Not on file  Other Topics Concern  . Not on file  Social History Narrative  . Not on file   Social Determinants of Health   Financial Resource Strain: Not on file  Food Insecurity: Not on file  Transportation Needs: Not on file  Physical Activity: Not on file  Stress: Not on file  Social Connections: Not on file  Intimate Partner Violence: Not on file    Allergies  Allergen Reactions  . Contrast Media [Iodinated Diagnostic Agents]   . Naproxen Nausea And Vomiting    Current Facility-Administered Medications  Medication Dose Route Frequency Provider Last  Rate Last Admin  . 0.9 %  sodium chloride infusion  250 mL Intravenous PRN Sherren Mocha, MD 10 mL/hr at 09/21/2020 1100 Infusion Verify at 09/26/2020 1100  . amiodarone (NEXTERONE PREMIX) 360-4.14 MG/200ML-% (1.8 mg/mL) IV infusion  60 mg/hr Intravenous Continuous Clegg, Amy D, NP 33.3 mL/hr at 10/06/2020 1613 60 mg/hr at 09/27/2020 1613   Followed  by  . amiodarone (NEXTERONE PREMIX) 360-4.14 MG/200ML-% (1.8 mg/mL) IV infusion  30 mg/hr Intravenous Continuous Clegg, Amy D, NP      . aspirin chewable tablet 81 mg  81 mg Per Tube Daily Einar Grad, RPH   81 mg at 09/14/2020 1122  . atorvastatin (LIPITOR) tablet 80 mg  80 mg Per Tube Daily Einar Grad, RPH   80 mg at 10/01/20 2025  . cefTRIAXone (ROCEPHIN) 2 g in sodium chloride 0.9 % 100 mL IVPB  2 g Intravenous Q24H Buford Dresser, MD 200 mL/hr at 09/10/2020 1411 2 g at 09/22/2020 1411  . chlorhexidine gluconate (MEDLINE KIT) (PERIDEX) 0.12 % solution 15 mL  15 mL Mouth Rinse BID Buford Dresser, MD   15 mL at 09/30/2020 0800  . Chlorhexidine Gluconate Cloth 2 % PADS 6 each  6 each Topical Daily Angelica Pou, MD   6 each at 10/01/20 [phone]  . docusate (COLACE) 50 MG/5ML liquid 100 mg  100 mg Per Tube BID Ollis, Brandi L, NP      . enoxaparin (LOVENOX) injection 30 mg  30 mg Subcutaneous Q24H Rebbeca Paul B, RPH   30 mg at 09/12/2020 1122  . ezetimibe (ZETIA) tablet 10 mg  10 mg Per Tube Daily Einar Grad, RPH   10 mg at 10/08/2020 1122  . insulin aspart (novoLOG) injection 0-15 Units  0-15 Units Subcutaneous QID Buford Dresser, MD   3 Units at 10/06/2020 1425  . insulin aspart (novoLOG) injection 3 Units  3 Units Subcutaneous QID Buford Dresser, MD   3 Units at 10/01/20 1828  . insulin glargine (LANTUS) injection 12 Units  12 Units Subcutaneous Daily Virl Axe, MD   12 Units at 10/01/20 1302  . MEDLINE mouth rinse  15 mL Mouth Rinse 10 times per day Buford Dresser, MD   15 mL at 09/14/2020 1000  . norepinephrine (LEVOPHED) 16 mg in 241m premix infusion  0-40 mcg/min Intravenous Titrated Ollis, Brandi L, NP 11.25 mL/hr at 09/13/2020 1100 12 mcg/min at 10/05/2020 1100  . ondansetron (ZOFRAN) injection 4 mg  4 mg Intravenous Q6H PRN CSherren Mocha MD   4 mg at 09/14/2020 1250  . pantoprazole (PROTONIX) injection 40 mg  40 mg Intravenous Q24H  CBuford Dresser MD   40 mg at 10/01/20 2021  . polyethylene glycol (MIRALAX / GLYCOLAX) packet 17 g  17 g Per Tube Daily Ollis, Brandi L, NP   17 g at 09/10/2020 1123  . sodium chloride flush (NS) 0.9 % injection 10-40 mL  10-40 mL Intracatheter PRN WAngelica Pou MD   10 mL at 09/29/20 2115    REVIEW OF SYSTEMS: Unable to provide a ROS. Critically ill.  PHYSICAL EXAM  Vitals:   09/21/2020 1115 09/19/2020 1123 10/06/2020 1130 09/28/2020 1145  BP:      Pulse: (!) 102  (!) 107 (!) 107  Resp: 18  19 (!) 27  Temp:  98.8 F (37.1 C)    TempSrc:  Oral    SpO2: 100%  100% 100%  Weight:      Height:  Constitutional: Critically ill appearing. Mild distress. Appears well nourished.  Neurologic: CN intact. No motor or sensory function in the right leg below the hip. Psychiatric: Mood and affect symmetric and appropriate. Eyes: No icterus. No conjunctival pallor. Ears, nose, throat: mucous membranes moist. Midline trachea.  Cardiac: Irregular rate and rhythm. AED pads over chest. Respiratory: unlabored. Abdominal: soft, non-tender, non-distended.  Peripheral vascular:  No doppler flow in the R pedal vessels  Monophasic doppler flow in the R popliteal artery  R leg is mottled.  No tenderness to leg to palpation  R thigh is tender to touch  L pedal doppler signals in PT / AT Extremity: No edema.   Skin: No gangrene. No ulceration.  Lymphatic: No Stemmer's sign. No palpable lymphadenopathy.  PERTINENT LABORATORY AND RADIOLOGIC DATA  Most recent CBC CBC Latest Ref Rng & Units 09/28/2020 09/17/2020 09/30/2020  WBC 4.0 - 10.5 K/uL - - 14.2(H)  Hemoglobin 12.0 - 15.0 g/dL 8.5(L) 8.5(L) 8.3(L)  Hematocrit 36.0 - 46.0 % 25.0(L) 25.0(L) 26.3(L)  Platelets 150 - 400 K/uL - - 529(H)     Most recent CMP CMP Latest Ref Rng & Units 09/21/2020 09/29/2020 09/23/2020  Glucose 70 - 99 mg/dL - - 86  BUN 8 - 23 mg/dL - - 56(H)  Creatinine 0.44 - 1.00 mg/dL - - 2.92(H)  Sodium 135 - 145  mmol/L 141 137 138  Potassium 3.5 - 5.1 mmol/L 4.0 3.9 3.9  Chloride 98 - 111 mmol/L - - 104  CO2 22 - 32 mmol/L - - 22  Calcium 8.9 - 10.3 mg/dL - - 8.1(L)  Total Protein 6.5 - 8.1 g/dL - - -  Total Bilirubin 0.3 - 1.2 mg/dL - - -  Alkaline Phos 38 - 126 U/L - - -  AST 15 - 41 U/L - - -  ALT 0 - 44 U/L - - -    Renal function Estimated Creatinine Clearance: 15.6 mL/min (A) (by C-G formula based on SCr of 2.92 mg/dL (H)).  Hgb A1c MFr Bld (%)  Date Value  09/10/2020 9.3 (H)    LDL Cholesterol  Date Value Ref Range Status  09/21/2020 177 (H) 0 - 99 mg/dL Final    Comment:           Total Cholesterol/HDL:CHD Risk Coronary Heart Disease Risk Table                     Men   Women  1/2 Average Risk   3.4   3.3  Average Risk       5.0   4.4  2 X Average Risk   9.6   7.1  3 X Average Risk  23.4   11.0        Use the calculated Patient Ratio above and the CHD Risk Table to determine the patient's CHD Risk.        ATP III CLASSIFICATION (LDL):  <100     mg/dL   Optimal  100-129  mg/dL   Near or Above                    Optimal  130-159  mg/dL   Borderline  160-189  mg/dL   High  >190     mg/dL   Very High Performed at [facility] [address] [place], [place] [address]     Yevonne Aline. Stanford Breed, MD [facility] Phone Number: [phone] 09/26/2020 5:28 PM

## 2020-10-02 NOTE — Progress Notes (Addendum)
   Called by nursing staff for ST possible VT . Extubated earlier.   Looks like narrow complex tachycardia- SVT  BP in the 90s. Stop milrinone and titrate Norepi to maintain Maps> 65  Start amio with 150 mg bolus now followed by amio drip.   Give 2 gram mag now.    Check BMET/Hs Trop.   Amy Clegg 3:53 PM  Patient seen with NP, agree with the above note.   She went into SVT this afternoon, does not look like flutter or fibrillation. HR 150. Tolerating poorly, BP dropping and having to increase norepinephrine.  Amiodarone started but has not converted rhythm.    Plan for emergent DCCV.  Discussed with CCM Dr. Valeta Harms who is at bedside and will help with sedation.   [name] 09/08/2020 4:41 PM

## 2020-10-02 NOTE — H&P (View-Only) (Signed)
   Called by nursing staff for ST possible VT . Extubated earlier.   Looks like narrow complex tachycardia- SVT versus  A flutter   BP in the 90s. Stop milrinone and titrate Norepi to maintain Maps> 65  Start amio with 150 mg bolus now followed by amio drip.   Give 2 gram mag now.    Check BMET/Hs Trop.     Sara [name] 3:53 PM

## 2020-10-03 DIAGNOSIS — R570 Cardiogenic shock: Secondary | ICD-10-CM | POA: Diagnosis not present

## 2020-10-03 DIAGNOSIS — Z515 Encounter for palliative care: Secondary | ICD-10-CM

## 2020-10-03 DIAGNOSIS — I5023 Acute on chronic systolic (congestive) heart failure: Secondary | ICD-10-CM | POA: Diagnosis not present

## 2020-10-03 DIAGNOSIS — Z66 Do not resuscitate: Secondary | ICD-10-CM

## 2020-10-03 DIAGNOSIS — Z7189 Other specified counseling: Secondary | ICD-10-CM

## 2020-10-03 LAB — BASIC METABOLIC PANEL
Anion gap: 18 — ABNORMAL HIGH (ref 5–15)
BUN: 59 mg/dL — ABNORMAL HIGH (ref 8–23)
CO2: 16 mmol/L — ABNORMAL LOW (ref 22–32)
Calcium: 8.2 mg/dL — ABNORMAL LOW (ref 8.9–10.3)
Chloride: 101 mmol/L (ref 98–111)
Creatinine, Ser: 3.82 mg/dL — ABNORMAL HIGH (ref 0.44–1.00)
GFR, Estimated: 12 mL/min — ABNORMAL LOW (ref >60)
Glucose, Bld: 157 mg/dL — ABNORMAL HIGH (ref 70–99)
Potassium: 6.2 mmol/L — ABNORMAL HIGH (ref 3.5–5.1)
Sodium: 135 mmol/L (ref 135–145)

## 2020-10-03 LAB — POTASSIUM
Potassium: 4.9 mmol/L (ref 3.5–5.1)
Potassium: 6 mmol/L — ABNORMAL HIGH (ref 3.5–5.1)
Potassium: 6.4 mmol/L (ref 3.5–5.1)

## 2020-10-03 LAB — COOXEMETRY PANEL
Carboxyhemoglobin: 0.9 % (ref 0.5–1.5)
Methemoglobin: 1 % (ref 0.0–1.5)
O2 Saturation: 48.9 %
Total hemoglobin: 10.9 g/dL — ABNORMAL LOW (ref 12.0–16.0)

## 2020-10-03 LAB — GLUCOSE, CAPILLARY
Glucose-Capillary: 196 mg/dL — ABNORMAL HIGH (ref 70–99)
Glucose-Capillary: 131 mg/dL — ABNORMAL HIGH (ref 70–99)
Glucose-Capillary: 107 mg/dL — ABNORMAL HIGH (ref 70–99)

## 2020-10-03 LAB — PROCALCITONIN: Procalcitonin: 0.55 ng/mL

## 2020-10-03 MED ORDER — SODIUM BICARBONATE 8.4 % IV SOLN
50.0000 meq | Freq: Once | INTRAVENOUS | Status: AC
  Administered 2020-10-03: 50 meq via INTRAVENOUS
  Filled 2020-10-03: qty 50

## 2020-10-03 MED ORDER — FUROSEMIDE 10 MG/ML IJ SOLN
80.0000 mg | Freq: Once | INTRAMUSCULAR | Status: AC
  Administered 2020-10-03: 80 mg via INTRAVENOUS
  Filled 2020-10-03: qty 8

## 2020-10-03 MED ORDER — DEXTROSE 50 % IV SOLN
1.0000 | Freq: Once | INTRAVENOUS | Status: AC
  Administered 2020-10-03: 50 mL via INTRAVENOUS
  Filled 2020-10-03: qty 50

## 2020-10-03 MED ORDER — DEXTROSE 50 % IV SOLN
50.0000 mL | Freq: Once | INTRAVENOUS | Status: DC
  Filled 2020-10-03: qty 50

## 2020-10-03 MED ORDER — CALCIUM GLUCONATE 10 % IV SOLN
1.0000 g | Freq: Once | INTRAVENOUS | Status: DC
  Filled 2020-10-03: qty 10

## 2020-10-03 MED ORDER — SODIUM POLYSTYRENE SULFONATE 15 GM/60ML PO SUSP
30.0000 g | Freq: Once | ORAL | Status: AC
  Administered 2020-10-03: 30 g via RECTAL
  Filled 2020-10-03: qty 120

## 2020-10-03 MED ORDER — ASPIRIN 300 MG RE SUPP: 150.0000 mg | Freq: Every day | RECTAL | Status: DC

## 2020-10-03 MED ORDER — INSULIN ASPART 100 UNIT/ML IV SOLN
10.0000 [IU] | Freq: Once | INTRAVENOUS | Status: AC
  Administered 2020-10-03: 10 [IU] via INTRAVENOUS

## 2020-10-03 MED ORDER — SCOPOLAMINE 1 MG/3DAYS TD PT72
1.0000 | MEDICATED_PATCH | TRANSDERMAL | Status: DC
  Administered 2020-10-03: 1.5 mg via TRANSDERMAL
  Filled 2020-10-03: qty 1

## 2020-10-06 LAB — CULTURE, BLOOD (ROUTINE X 2)
Culture: NO GROWTH
Culture: NO GROWTH
Special Requests: ADEQUATE
Special Requests: ADEQUATE

## 2020-10-09 NOTE — Progress Notes (Signed)
   Oct 15, 2020 1100  Clinical Encounter Type  Visited With Patient and family together  Visit Type Social support;Spiritual support  Referral From Nurse  Consult/Referral To Chaplain  Spiritual Encounters  Spiritual Needs Ritual  The chaplain responded to the nurse's request to be present with the patient and family. The chaplain was able to speak with the youngest son Sara [name]). The chaplain gave him a rosary. The chaplain offered social and emotional support. The patient is surrounded by love and the decisions they are faced with are tough to handle. The chaplain provided active listening for the son. The chaplain is available as needed and will follow up.

## 2020-10-09 NOTE — Progress Notes (Signed)
PCCM:  We will sign off at this time. I agree with comfort care transitions and palliative care consultation.   Please let PCCM know if we can be of any assistance.   Garner Nash, DO [facility] 2020-10-19 8:24 AM

## 2020-10-09 NOTE — Progress Notes (Signed)
Patient ID: Sara [name], female   DOB: 1951/04/18, 70 y.o.   MRN: [id number]     Advanced Heart Failure Rounding Note  PCP-Cardiologist: Evalina Field, MD   Subjective:   3/24 Transferred to ICU for shock/respiratory failure. Started milrinone + norepi.  3/25 Extubated.  Cold right lower leg => vascular consulted, nonsalvagable.  Developed incessant SVT => emergent DCCV.    Creatinine 2.8> 2.9>3.82   This morning, she is on NE 12 and amiodarone 30.  BP stable, remains in NSR.    K up to 6.2, being treated.    She has cough with secretions.    Objective:   Weight Range: 61 kg Body mass index is 24.6 kg/m.   Vital Signs:   Temp:  [97.8 F (36.6 C)-98.9 F (37.2 C)] 98.8 F (37.1 C) (03/26 0410) Pulse Rate:  [33-151] 88 (03/26 0700) Resp:  [11-34] 17 (03/26 0700) BP: (86-133)/(54-76) 101/57 (03/26 0700) SpO2:  [95 %-100 %] 99 % (03/26 0700) Arterial Line BP: (78-140)/(36-70) 104/41 (03/26 0700) FiO2 (%):  [30 %] 30 % (03/25 0802) Last BM Date: 09/29/20  Weight change: Filed Weights   09/30/20 0500 10/01/20 0500 09/08/2020 0500  Weight: 61.5 kg 61.6 kg 61 kg    Intake/Output:   Intake/Output Summary (Last 24 hours) at 2020/10/15 0753 Last data filed at 15-Oct-2020 0618 Gross per 24 hour  Intake 1152.71 ml  Output 595 ml  Net 557.71 ml      Physical Exam   CVP 14  General: NAD Neck: JVP 12 cm, no thyromegaly or thyroid nodule.  Lungs: Clear to auscultation bilaterally with normal respiratory effort. CV: Nondisplaced PMI.  Heart regular S1/S2, no S3/S4, 2/6 SEM RUSB.  No peripheral edema.  Cold right foot. Abdomen: Soft, nontender, no hepatosplenomegaly, no distention.  Skin: Intact without lesions or rashes.  Neurologic: Awake but not interactive Extremities: No clubbing or cyanosis.  HEENT: Normal.    Telemetry   NSR 90s-100s (personally reviewed).   EKG    N/A   Labs    CBC Recent Labs    10/01/20 0841 10/01/20 1212 09/15/2020 0329  09/23/2020 0340 09/08/2020 0539  WBC 11.6*  --  14.2*  --   --   HGB 8.2*   < > 8.3*  8.2* 8.5* 8.5*  HCT 27.2*   < > 26.3*  24.0* 25.0* 25.0*  MCV 94.1  --  88.3  --   --   PLT 501*  --  529*  --   --    < > = values in this interval not displayed.   Basic Metabolic Panel Recent Labs    10/01/20 2048 09/21/2020 0329 10/06/2020 1802 10-15-20 0500  NA 137   < > 135 135  K 3.8   < > 4.2 6.2*  CL 103   < > 100 101  CO2 23   < > 21* 16*  GLUCOSE 49*   < > 224* 157*  BUN 56*   < > 55* 59*  CREATININE 2.82*   < > 3.00* 3.82*  CALCIUM 8.4*   < > 8.1* 8.2*  MG 2.3  --   --   --    < > = values in this interval not displayed.   Liver Function Tests Recent Labs    10/06/2020 0329  AST 67*  ALT 23  ALKPHOS 88  BILITOT 0.6  PROT 5.4*  ALBUMIN 1.8*   No results for input(s): LIPASE, AMYLASE in the last 72 hours. Cardiac Enzymes  No results for input(s): CKTOTAL, CKMB, CKMBINDEX, TROPONINI in the last 72 hours.  BNP: BNP (last 3 results) Recent Labs    09/30/2020 0704 10/01/20 0842  BNP 1,649.4* 3,088.2*    ProBNP (last 3 results) No results for input(s): PROBNP in the last 8760 hours.   D-Dimer No results for input(s): DDIMER in the last 72 hours. Hemoglobin A1C No results for input(s): HGBA1C in the last 72 hours. Fasting Lipid Panel No results for input(s): CHOL, HDL, LDLCALC, TRIG, CHOLHDL, LDLDIRECT in the last 72 hours. Thyroid Function Tests No results for input(s): TSH, T4TOTAL, T3FREE, THYROIDAB in the last 72 hours.  Invalid input(s): FREET3  Other results:   Imaging    No results found.   Medications:     Scheduled Medications: . aspirin  81 mg Per Tube Daily  . atorvastatin  80 mg Per Tube Daily  . chlorhexidine gluconate (MEDLINE KIT)  15 mL Mouth Rinse BID  . Chlorhexidine Gluconate Cloth  6 each Topical Daily  . docusate  100 mg Per Tube BID  . enoxaparin (LOVENOX) injection  30 mg Subcutaneous Q24H  . ezetimibe  10 mg Per Tube Daily  .  HYDROmorphone   Intravenous Q4H  . insulin aspart  0-15 Units Subcutaneous QID  . insulin aspart  3 Units Subcutaneous QID  . insulin glargine  12 Units Subcutaneous Daily  . mouth rinse  15 mL Mouth Rinse 10 times per day  . pantoprazole (PROTONIX) IV  40 mg Intravenous Q24H  . polyethylene glycol  17 g Per Tube Daily  . sodium polystyrene  30 g Rectal Once    Infusions: . sodium chloride Stopped (09/22/2020 1411)  . amiodarone 30 mg/hr (10/19/20 0618)  . cefTRIAXone (ROCEPHIN)  IV Stopped (10/07/2020 1441)  . norepinephrine (LEVOPHED) Adult infusion 12 mcg/min (October 19, 2020 0500)    PRN Medications: sodium chloride, diphenhydrAMINE **OR** diphenhydrAMINE, naloxone **AND** sodium chloride flush, ondansetron (ZOFRAN) IV, sodium chloride flush    Patient Profile   70 y.o.femalewith ahistory ofthreevessel CAD noted on cardiac catheterization in 01/2020, ischemic cardiomyopathy/chronic systolic CHF, stroke, severe PAD. hypertension, diabetes mellitus, CKD III-IV,admitted 03/99fr CHF andNSTEMI. Found to have severe low-gradient AS with EF 25-30%. Awaiting CABG/AVR with Dr. BCyndia Bent Has been on milrinone with co-ox in mid 582s  Assessment/Plan  1. Shock - suspect primarily cardiac but may also have septic component.  - continue NE 12 mcg.  - On ceftriaxone.  - Blood Cx - NGTD   2. Acute on chronic CHF with ischemic cardiomyopathy -> cardiogenic shock: -Echo performed 09/24/20 with LVEF at 25-30% with mildly dilated ventricle, severely calcified aortic valve with AVA 0.87 cm2 and DVI 0.31 consistent with moderate to severe aortic stenosis>>she has known multi vessel CAD [place]LHC3/18/2022showed severe CAD and at least moderate aortic stenosis, ideally needs CABG with AVR. At this point, not candidate for cardiac surgery.  CVP 14 today, stable MAP on NE 12. Co-ox low at 49%.  - continue NE.  - Co-ox low but will  not further adjust inotropes/pressors given plan to move towards comfort care.  - CVP 14. Given 80 mg IV lasix now with elevated K.   3. Acute hypoxic respiratory failure - Extubated 3/25 but now with increased secretions/cough.    - CCM following.   4. Moderate to severe AS: -Echo as above with LVEF at 25-30% with mildly dilated ventricle, severely calcified aortic valve with AVA 0.87 cm2 and DVI 0.31  consistent with moderate to severe aortic stenosis.  - Suspect low flow/low gradient severe AS - be careful with pre-load  5. NSTEMI with known multivessel CAD: -Found to have 3VD 01/2020>>s/p cMRI with viable myocardium with workup for CABG in [place] however never performed -HsT peak at 2156 this admission>>no recurrent chest pain -Continue ASA,statin and Zetia  -Ideally would have CABG/AVR  6. Acute on chronic kidney disease stage IV: - Baseline Cr seems to b 1.8 - 2.0  - Creatinine 2.6>2.9>3.8 today, progressive AKI.  Now hyperkalemic.  - Treat K this morning but not candidate for RRT.   7. SVT:  - Rapid SVT yesterday, required DCCV.  Now NSR on amiodarone.   8. PAD:  - Cold right foot, only option per vascular would be right AKA.  Suspect attempting this operation would be fatal.   Based on her current clinical situation with cardiogenic shock, progressive AKI, and ischemic right foot, I do not think we have good options for forward progress. I spoke with her daughter this morning (patient is not very interactive currently) and recommended DNR/DNI, comfort care.  Daughter will talk with the rest of the family to come to a decision and I will ask palliative care to consult.   CRITICAL CARE Performed by: [name]  Total critical care time: 40 minutes  Critical care time was exclusive of separately billable procedures and treating other patients.  Critical care was necessary to treat or prevent imminent or life-threatening deterioration.  Critical care was time  spent personally by me on the following activities: development of treatment plan with patient and/or surrogate as well as nursing, discussions with consultants, evaluation of patient's response to treatment, examination of patient, obtaining history from patient or surrogate, ordering and performing treatments and interventions, ordering and review of laboratory studies, ordering and review of radiographic studies, pulse oximetry and re-evaluation of patient's condition.    Length of Stay: 52  [name], MD  Oct 08, 2020, 7:53 AM  Advanced Heart Failure Team Pager [phone] (M-F; 7a - 5p)  Please contact [facility] for night-coverage after hours (5p -7a ) and weekends on [email or web address]

## 2020-10-09 NOTE — Progress Notes (Signed)
Date and time results received: 2020-10-21 1700 Test:Potassium Critical Value: 6.4  Name of Provider Notified: Champ Mungo MD  Orders Received? Or Actions Taken?: Orders Given  Consuella Lose, RN

## 2020-10-09 NOTE — Consult Note (Signed)
Palliative Medicine Inpatient Consult Note  Reason for consult:  Goals of Care; End of Life Management  HPI:  Per intake H&P --> 70 year old female past medical history three-vessel coronary disease, cardiac catheterization in July 2021, ischemic cardiomyopathy, chronic systolic heart failure, history of stroke, severe peripheral arterial disease, hypertension, type 2 diabetes, CKD stage IV.  Patient was admitted on 09/24/2020 for acute on chronic systolic heart failure exacerbation with decompensation and NSTEMI.  Patient was found to have severe low gradient aortic stenosis with a reduced ejection fraction of 25 to 30%.  Palliative care was asked to get involved in the setting of decompensation with poor prognosis for recovery.  Clinical Assessment/Goals of Care:  *Please note that this is a verbal dictation therefore any spelling or grammatical errors are due to the "[place] [employer]" system interpretation.  I have reviewed medical records including EPIC notes, labs and imaging, received report from bedside RN, assessed the patient who was receiving a kayexalate enema.    I met with three of patients children. Her daughter, Baxter Flattery, son, [name], and other son, Valarie Merino.to further discuss diagnosis prognosis, GOC, EOL wishes, disposition and options.   I introduced Palliative Medicine as specialized medical care for people living with serious illness. It focuses on providing relief from the symptoms and stress of a serious illness. The goal is to improve quality of life for both the patient and the family.  Valarie Merino shares with me that Raymie is from [place] originally. She moved to [place] in 1999. She is not married. She has five children - three daughters and two sons. She has six grandchildren. 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 She was known to have been good at everything and loved gardening. She is a woman of the [place].  Prior to  hospitalization Genea had been living independently.She was able to attend to her bADL's and iADL's.  Her family shares that she has declined fairly rapidly during the course of this hospitalization. They express great disbelief at her deterioration. We reviewed her hospital course and the triumphs and tribulations she has had along these past thirteen days. We discussed the "roller coaster" of emotions both she and her family have experienced.   We discussed that Emelin has multiple organ systems which are not functioning properly and are requiring a high degree of life support. Reviewed her heart failure and that she is not a candidate for any advanced therapies. Reviewed her kidney function which is continuing to worsen and the reality that she is anuric. We reviewed her PAD and that she would not be a candidate for interventions but more so per her family that she would not want her leg to be amputated.   A detailed discussion was had today regarding advanced directives - there are none on file. Williette's five children make decisions with one another in regards to her wishes.    Concepts specific to code status, artifical feeding and hydration, continued IV antibiotics and rehospitalization was had.  Baxter Flattery shares that her mother made her promise her to never live a lifer on support. She would not want to be intubated and would not wish to live if she were reliant on other people. She is a strong and independent woman. Her family is struggling to come to grips with the unfortunate reality that she will likely pass away from the degree of cardiogenic shock she has suffered. He youngest son, Chester Holstein seems to be the patients biggest advocate  sharing that she had expressed already that she knew she would not make it out of the hospital. The whole family does not want to see her suffer but also express that they do not want to let her go.  I shared with Emiyah's children that these are among the most  difficult decisions to make. I shared that I would not wish to place her through additional pain and suffering knowing that the outcome was not going to change the bigger picture. They all understood this.  Discussed in the setting of a poor prognosis as Shenika sadly has that we have to think about the greater picture and the quality of life that the patient is living. Discussed comfort focused care and allowing Aleksa to meet her maker with dignity and peace. Her family understood this, though understandably is having a very hard time. We discussed for now focusing on the moments they have with their mother.  We discussed that additional family members will be informed of Olivine's present health state. Her daughter, Tanja Port is traveling from [place] and will arrive this evening.  I shared of all the decisions to make today I think the DNR would be the most imperative.  WE reviewed that honoring Noami's wishes is the greatest respect we can pay her during this phase of her life. Her family wanted to talk among themselves. I shared that I will be available throughout the day.  Discussed the importance of continued conversation with family and their  medical providers regarding overall plan of care and treatment options, ensuring decisions are within the context of the patients values and GOCs.  Decision Maker:  SUMMARY OF RECOMMENDATIONS   Full Code / Full scope of care --> I strongly recommended DNR given all of the insults that Tirsa has already faced and the reality that outcomes will be poor either way. Her children have one more sibling that they would like to discuss this with prior to being in agreement.  Reviewed poor prognosis and the idea of allowing Adella to be comfortable as she has endured a tremendous amount in the way of interventions in a short period of time and the reality that multiple organ systems are in great dysfunction/failure.  Appreciate Chaplain Support - Patient is a  strong Media planner PMT conversations  Code Status/Advance Care Planning: FULL CODE   Palliative Prophylaxis:   Oral Care, Turn Q2H  Additional Recommendations (Limitations, Scope, Preferences):  Continue current scope of care  Psycho-social/Spiritual:   Desire for further Chaplaincy support: YesJacqulyn Liner Christian  Additional Recommendations:    Prognosis: Very poor in the setting of cardiogenic shock  Discharge Planning: Unclear  Vitals:   10-06-20 1028 06-Oct-2020 1030  BP:  (!) 93/52  Pulse:  77  Resp:  17  Temp: 98 F (36.7 C)   SpO2:  100%    Intake/Output Summary (Last 24 hours) at 2020-10-06 1058 Last data filed at 10-06-2020 0900 Gross per 24 hour  Intake 1155.22 ml  Output 435 ml  Net 720.22 ml   Last Weight  Most recent update: 09/15/2020  6:00 AM   Weight  61 kg (134 lb 7.7 oz)           Gen:  Elderly AA F extremely ill appearing  HEENT: Dry mucous membranes CV: Regular rate and rhythm  PULM:  3LPM [place] ABD: soft/nontender  EXT: No edema  Neuro: Somnolent, does open eyes intermittently  PPS: 10%   This conversation/these recommendations were discussed with patient  primary care team, Dr. Aundra Dubin  Time In: 0825 Time Out: 1015 Total Time: 110 minutes Greater than 50%  of this time was spent counseling and coordinating care related to the above assessment and plan.  [place] Team Team Cell Phone: [phone] Please utilize secure chat with additional questions, if there is no response within 30 minutes please call the above phone number  Palliative Medicine Team providers are available by phone from 7am to 7pm daily and can be reached through the team cell phone.  Should this patient require assistance outside of these hours, please call the patient's attending physician.

## 2020-10-09 NOTE — Progress Notes (Signed)
PCCM INTERVAL PROGRESS NOTE   Hyperkalemia 6.2  - EKG - insulin/dextrose - sodium bicarb - RN does not feel she can swallow so will hold lokelma for now - Can do PR kayexalate if necessary. Repeat K at 0800 - Not a candidate for HD, will need to further address goals of care with multidisciplinary team.    Georgann Housekeeper, AGACNP-BC [place] for personal pager PCCM on call pager [phone] until 7pm. Please call Elink 7p-7a. [phone]  2020-10-21 5:44 AM

## 2020-10-09 NOTE — Progress Notes (Signed)
Patient went into PEA arrest and went asystole, DNR. Pt. Deceased at 1742, pronounced by Donnetta Simpers, RN and Eloise Harman, RN  Consuella Lose, RN

## 2020-10-09 NOTE — Progress Notes (Signed)
  Palliative Medicine Inpatient Consult Note  Reason for consult:  Goals of Care; End of Life Management  HPI:  Per intake H&P --> 70 year old female past medical history three-vessel coronary disease, cardiac catheterization in July 2021, ischemic cardiomyopathy, chronic systolic heart failure, history of stroke, severe peripheral arterial disease, hypertension, type 2 diabetes, CKD stage IV. Patient was admitted on 09/26/2020 for acute on chronic systolic heart failure exacerbation with decompensation and NSTEMI. Patient was found to have severe low gradient aortic stenosis with a reduced ejection fraction of 25 to 30%.  Palliative care was asked to get involved in the setting of decompensation with poor prognosis for recovery.  Clinical Assessment/Goals of Care:  *Please note that this is a verbal dictation therefore any spelling or grammatical errors are due to the "[place] [employer]" system interpretation.  Patients bedside RN, Sara [name] expressed concerns regarding Sara [name]'s K level, her altered mental state, increased HR, widened QRS, & decreased MAP despite increasing pressors.   I went to bedside. Patient niece was present. Patient herself was very ill appearing. Explained the present worries to patients niece.  I was able to speak to patients son, Sara [name]. He did get in touch with his sister, Sara [name] who is also in agreement with DNAR/DNI code status. All five of Sara [name]'s children have agreed to change code status. This change was completed.  I recommended family get to bedside as I fear Sara [name]'s time is short. Sara [name] shared that his family was on the way.   SUMMARY OF RECOMMENDATIONS   DNAR/DNI  Family provided an update on patients changing condition  Code Status/Advance Care Planning: DNAR/DNI  This conversation/these recommendations were discussed with patient primary care team, Dr. Aundra Dubin  Time In: 1647 Time Out: 1811 Total Time: 24 Greater than 50%  of this time was  spent counseling and coordinating care related to the above assessment and plan.  [place] Team Team Cell Phone: [phone] Please utilize secure chat with additional questions, if there is no response within 30 minutes please call the above phone number  Palliative Medicine Team providers are available by phone from 7am to 7pm daily and can be reached through the team cell phone.  Should this patient require assistance outside of these hours, please call the patient's attending physician.

## 2020-10-09 DEATH — deceased

## 2020-10-19 ENCOUNTER — Encounter (HOSPITAL_COMMUNITY): Payer: Self-pay | Admitting: *Deleted

## 2020-10-19 NOTE — Discharge Summary (Addendum)
  Advanced Heart Failure Death Summary  Death Summary   Patient ID: Sara [name] MRN: [id number], DOB/AGE: 02/28/1951 70 y.o. Admit date: 09/30/2020 D/C date:     10/10/20   Primary Discharge Diagnoses:  Shock A/C Systolic Heart Failure, ICM-->Cardiogenic Shock  A/C Hypoxic Respiratory Failure Moderate to severe AS NSTEMI with know Multivessel CAD AKI  SVT  Ischemic Right Leg    Hospital Course:  70 y.o.femalewith ahistory ofthreevessel CAD noted on cardiac catheterization in 01/2020, ischemic cardiomyopathy/chronic systolic CHF, stroke,severe PAD.hypertension, diabetes mellitus, CKD III-IV.  Admitted 03/83for CHF andNSTEMI. Cardiology consulted. They found that she was admitted in [place] [facility] [place] in July of 2021 where she underwent a cardiac cath and MRI resulting in newly diagnosed with three-vessel CAD, was instructed to follow up with CVTS but was unable to follow up due to the COVID-19 pandemic. Her ECHO on 3/14/22was notable for moderate aortic stenosis with an EF of 25 to 30%. Heart cath on 09/24/2020 was also notable for severe three vessel disease, moderate aortic stenosis, and a cardiac index of 5 while on milrinone.CVTS was consulted on 09/08/2020 for evaluation with plans to complete a CABG and AVR with Dr. Cyndia Bent however timing of the surgery was uncertain due to a rising creatinine.   Found to have severe low-gradient AS with EF 25-30%. There was concern CABG/AVR would result in worsening renal function. Placed on milrinone with co-ox in mid 85s.On 3/24 developed shock/respiratory failure.  CCM consulted. Started on milrinone + norepi and urgently intubated.  Had severe respiratory/lactic acidosis. Lactic acid was 9. On 3/25 she was extubated and later developed SVT. Placed on amio drip with no improvement so urgent cardioversion was pursued.  After cardioversion right lower extremity mottled. Vascular Surgery consult. Unfortunately she had non salvageable ischemic right lowe  extremity. She was not a candidate for amputation given evidence of multisystem organ failure.   Palliative Care team consulted and on 10/10/2020 she transitioned to DNR/DNI.    Significant Diagnostic Studies  ETT 03/24 >>extubated 3/25  Femoral A Line 03/24   Ceftriaxone 03/24  Blood Culture 03/24  Urine Culture 03/24 >> No growth RHC/LHC 09/26/2020   Ost LAD lesion is 35% stenosed.  Prox LAD to Mid LAD lesion is 95% stenosed.  Lat 1st Diag lesion is 90% stenosed.  1st Diag-1 lesion is 90% stenosed.  1st Diag-2 lesion is 80% stenosed.  Prox Cx lesion is 75% stenosed.  1st Mrg lesion is 40% stenosed.  Mid Cx to Dist Cx lesion is 75% stenosed.  Prox RCA to Mid RCA lesion is 80% stenosed.  Dist RCA lesion is 50% stenosed.  RPDA lesion is 80% stenosed.  1st RPL lesion is 80% stenosed.  1.  Severe three-vessel coronary artery disease:  Severe stenosis of the mid LAD/first diagonal with complex calcific disease  Severe stenosis of the proximal and distal circumflex  Severe stenosis of the mid RCA and the PDA/PLA 2.  Hemodynamic findings consistent with probable moderate aortic stenosis, possibly with low flow component in the setting of severe LV dysfunction.  Peak to peak gradient 16 mmHg, mean gradient 9 mmHg.  Aortic valve crossed with a J-wire. 3.  Preserved cardiac output on milrinone with cardiac index 5 L/min/m, low right heart filling pressures, LVEDP 19 mmHg.  Consultations  CCM  Vascular Surgery  Advanced Heart Failure  Palliative Care    Duration of Discharge Encounter: Greater than 35 minutes   Signed, Darrick Grinder, NP 10/19/2020, 5:10 PM

## 2020-10-19 NOTE — Progress Notes (Signed)
Received fax copy of pt's DC, form completed and signed by Dr Haroldine Laws and faxed back to [facility] at [phone]

## 2020-10-21 NOTE — Progress Notes (Signed)
Received original DC, form completed and signed by Dr Haroldine Laws and mailed to [facility].

## 2022-12-28 IMAGING — DX DG CHEST 1V PORT
1 series · 1 of 1 positions shown · non-contrast
Comparison: 09/20/2020

CLINICAL DATA: Difficulty breathing

EXAM:
PORTABLE CHEST 1 VIEW

[chest ap]
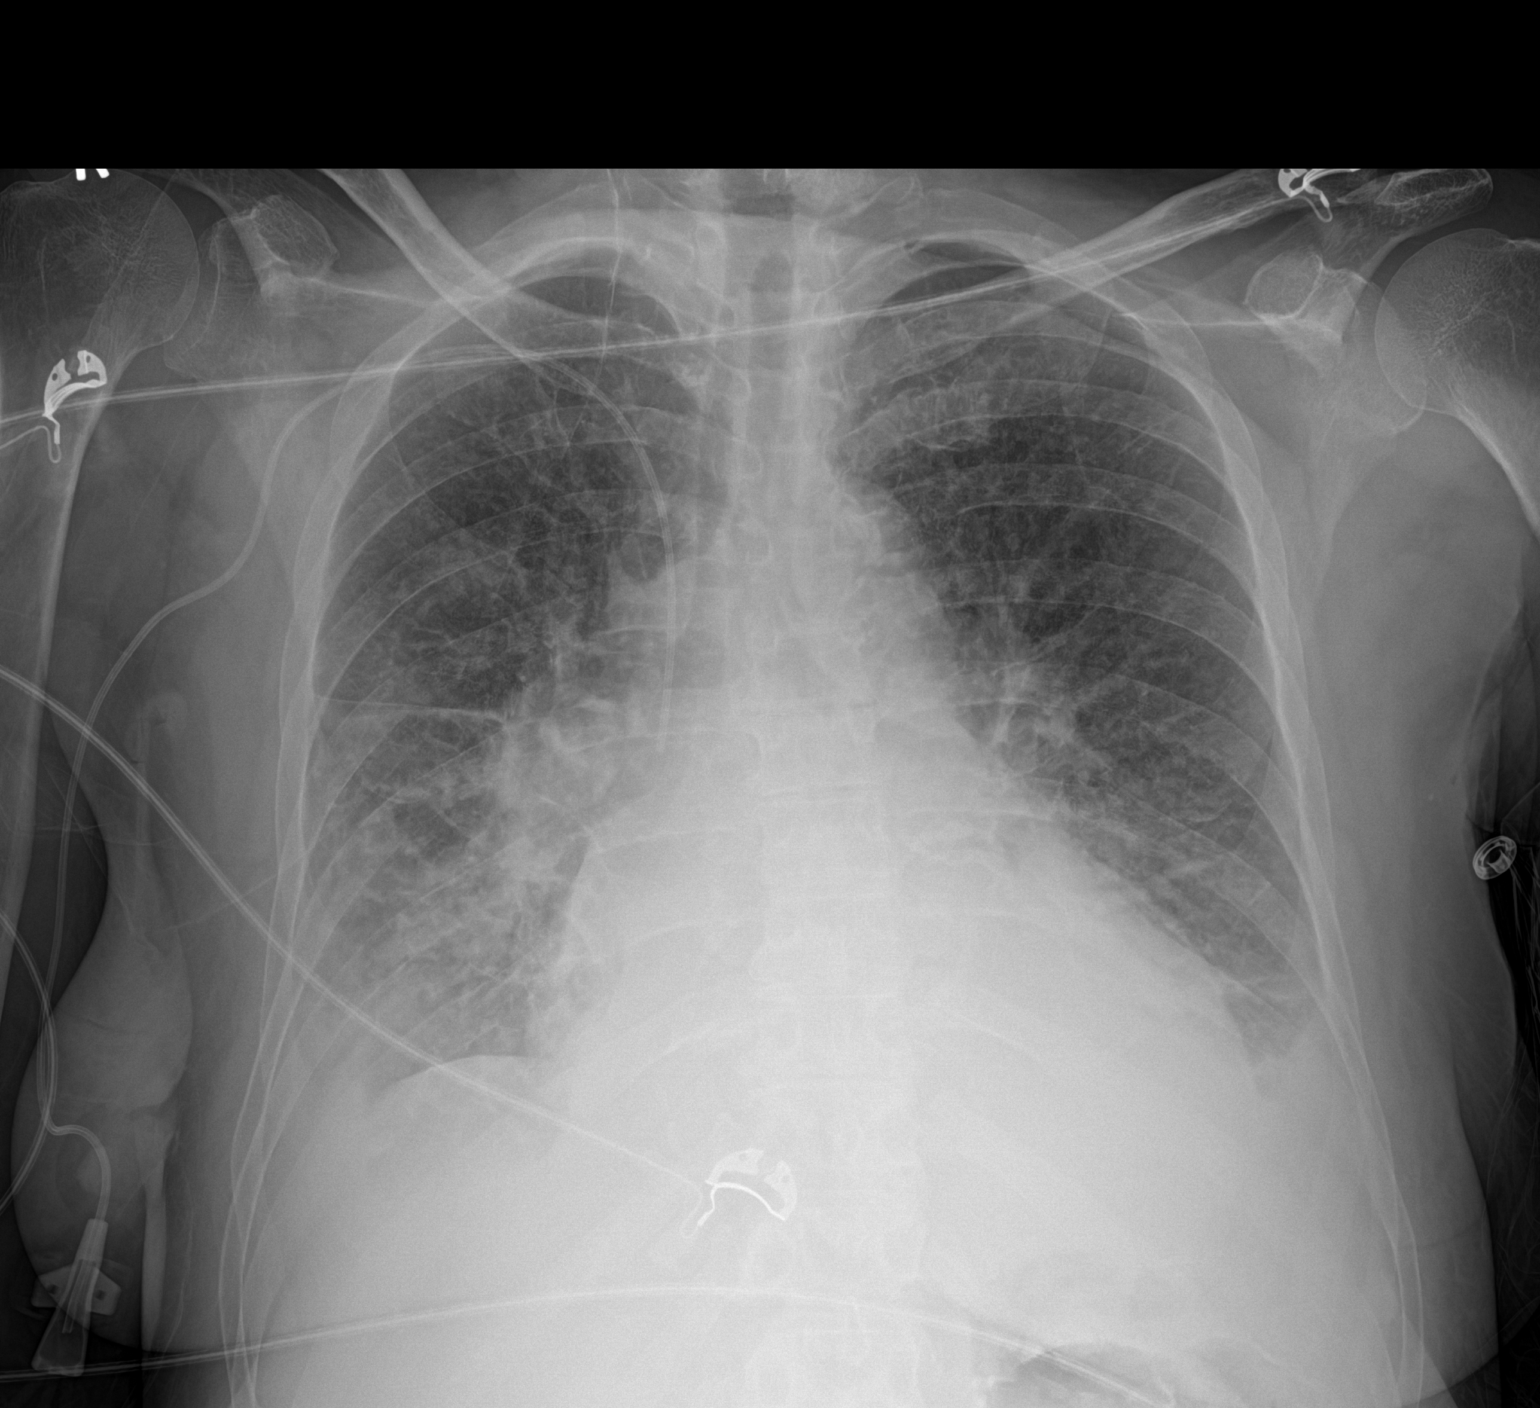

[1 of 1 positions shown; findings below may reference images not displayed]

FINDINGS: Cardiac shadow is enlarged but stable. Right-sided PICC line is now
seen in satisfactory position at the cavoatrial junction. Increasing
vascular congestion is noted consistent with CHF with mild
interstitial parenchymal edema identified. Persistent but improved
right basilar infiltrate is seen. Small effusions are noted
bilaterally. No bony abnormality is seen.
IMPRESSION: Increasing changes of CHF. Some persistent right basilar airspace
opacity is noted likely representing superimposed infiltrate.

PICC line in satisfactory position.
